# Patient Record
Sex: Male | Born: 1990 | Race: Black or African American | Hispanic: No | Marital: Single | Smoking: Former smoker
Health system: Southern US, Community
[De-identification: ages and names within clinical notes are randomized; demographics above are authoritative.]

---
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
## 2011-06-24 ENCOUNTER — Emergency Department (HOSPITAL_COMMUNITY)
Admission: EM | Admit: 2011-06-24 | Discharge: 2011-06-24 | Disposition: A | Discharge status: Home / Self Care | Payer: Self-pay | Attending: Emergency Medicine | Admitting: Emergency Medicine

## 2011-06-24 ENCOUNTER — Other Ambulatory Visit: Payer: Self-pay

## 2011-06-24 ENCOUNTER — Emergency Department (HOSPITAL_COMMUNITY): Payer: Self-pay

## 2011-06-24 DIAGNOSIS — M940 Chondrocostal junction syndrome [Tietze]: Secondary | ICD-10-CM | POA: Insufficient documentation

## 2011-06-24 MED ORDER — KETOROLAC TROMETHAMINE 30 MG/ML IJ SOLN
30.0000 mg | Freq: Once | INTRAMUSCULAR | Status: AC
  Administered 2011-06-24: 30 mg via INTRAVENOUS
  Filled 2011-06-24: qty 1

## 2011-06-24 MED ORDER — IBUPROFEN 600 MG PO TABS: 600.0000 mg | ORAL_TABLET | Freq: Four times a day (QID) | ORAL | Status: AC | PRN

## 2011-06-24 NOTE — ED Notes (Signed)
Reports intermittent substernal chest pain, describes as pressure/squeezing, x 1 month; reports onset today of same pain at 1400 while at rest; c/o nausea with pain; denies pain presently; ECG completed, placed on cardiac monitor; IV access obtained and labs drawn. A&ox4; in no distress.

## 2011-06-24 NOTE — ED Notes (Signed)
Pt states h has been having chest pain for a month or more. States it is worse when he runs

## 2011-06-24 NOTE — ED Notes (Signed)
A&ox4, in no distress

## 2011-06-24 NOTE — ED Provider Notes (Signed)
History     CSN: [id number] Arrival date & time: 06/24/2011  4:15 PM  Chief Complaint  Patient presents with  . Chest Pain   Patient is a 20 y.o. male presenting with chest pain. The history is provided by the patient.  Chest Pain The chest pain began more  than 1 month ago. Chest pain occurs intermittently. The chest pain is unchanged. The pain is associated with breathing. At its most intense, the pain is at 3/10. The pain is currently at 3/10. The severity of the pain is moderate. The quality of the pain is described as dull, pleuritic and pressure-like (Intermittent pain for past month upper sternal level, sometimes worse with exertion , but not always.). The pain does not radiate (When it happens when running,  he generally "runs through it" and it gets better.). Exacerbated by: nothing. Risk factors include no known risk factors. Family history comments: Emelia Loron only deceased last year of MI     History reviewed. No pertinent past medical history.  History reviewed. No pertinent past surgical history.  History reviewed. No pertinent family history.  History  Substance Use Topics  . Smoking status: Never Smoker   . Smokeless tobacco: Not on file  . Alcohol Use: No      Review of Systems  Cardiovascular: Positive for chest pain.    Physical Exam  BP 150/87  Pulse 77  Temp(Src) 98.2 F (36.8 C) (Oral)  Resp 20  Ht 6' (1.829 m)  Wt 190 lb (86.183 kg)  BMI 25.77 kg/m2  SpO2 100%  Physical Exam  Vitals reviewed. Constitutional: He is oriented to person, place, and time. He appears well-developed and well-nourished.  HENT:  Head: Normocephalic and atraumatic.  Eyes: Conjunctivae are normal.  Neck: Normal range of motion.  Cardiovascular: Normal rate, regular rhythm, normal heart sounds and intact distal pulses.   Pulmonary/Chest: Effort normal and breath sounds normal. He has no wheezes. He has no rales. He exhibits no tenderness.  Abdominal: Soft. Bowel sounds  are normal. There is no tenderness.  Musculoskeletal: Normal range of motion. He exhibits no edema and no tenderness.  Neurological: He is alert and oriented to person, place, and time.  Skin: Skin is warm and dry.  Psychiatric: He has a normal mood and affect.    ED Course  Procedures  MDM     Date: 06/24/2011  Rate: 81  Rhythm: normal sinus rhythm  QRS Axis: normal  Intervals: normal  ST/T Wave abnormalities: normal  Conduction Disutrbances:none  Narrative Interpretation:   - stemi,  - wpw,  - LGL,  - IHSS,  - brugada pattern.  Old EKG Reviewed: none available    Medical screening examination/treatment/procedure(s) were performed by non-physician practitioner and as supervising physician I was immediately available for consultation/collaboration.    Candis Musa, PA 06/24/11 1752  Chrisoula Zegarra K Ordean Fouts-Rasch, MD 06/24/11 2132

## 2012-07-16 ENCOUNTER — Encounter (HOSPITAL_COMMUNITY): Payer: Self-pay | Admitting: *Deleted

## 2012-07-16 ENCOUNTER — Emergency Department (HOSPITAL_COMMUNITY)
Admission: EM | Admit: 2012-07-16 | Discharge: 2012-07-16 | Disposition: A | Discharge status: Home / Self Care | Payer: No Typology Code available for payment source | Attending: Physician Assistant | Admitting: Physician Assistant

## 2012-07-16 DIAGNOSIS — M62838 Other muscle spasm: Secondary | ICD-10-CM | POA: Insufficient documentation

## 2012-07-16 DIAGNOSIS — S46919A Strain of unspecified muscle, fascia and tendon at shoulder and upper arm level, unspecified arm, initial encounter: Secondary | ICD-10-CM

## 2012-07-16 DIAGNOSIS — M549 Dorsalgia, unspecified: Secondary | ICD-10-CM | POA: Insufficient documentation

## 2012-07-16 DIAGNOSIS — S39012A Strain of muscle, fascia and tendon of lower back, initial encounter: Secondary | ICD-10-CM

## 2012-07-16 MED ORDER — DIAZEPAM 5 MG PO TABS
5.0000 mg | ORAL_TABLET | Freq: Once | ORAL | Status: AC
  Administered 2012-07-16: 5 mg via ORAL
  Filled 2012-07-16: qty 1

## 2012-07-16 MED ORDER — ONDANSETRON HCL 4 MG PO TABS
4.0000 mg | ORAL_TABLET | Freq: Once | ORAL | Status: AC
  Administered 2012-07-16: 4 mg via ORAL
  Filled 2012-07-16: qty 1

## 2012-07-16 MED ORDER — MORPHINE SULFATE 4 MG/ML IJ SOLN
8.0000 mg | Freq: Once | INTRAMUSCULAR | Status: AC
  Administered 2012-07-16: 8 mg via INTRAMUSCULAR
  Filled 2012-07-16: qty 2

## 2012-07-16 MED ORDER — METHOCARBAMOL 500 MG PO TABS: ORAL_TABLET | ORAL | Status: DC

## 2012-07-16 MED ORDER — OXYCODONE-ACETAMINOPHEN 5-325 MG PO TABS: 1.0000 | ORAL_TABLET | Freq: Four times a day (QID) | ORAL | Status: AC | PRN

## 2012-07-16 MED ORDER — MORPHINE SULFATE 4 MG/ML IJ SOLN: 8.0000 mg | Freq: Once | INTRAMUSCULAR | Status: DC

## 2012-07-16 MED ORDER — DEXAMETHASONE SODIUM PHOSPHATE 4 MG/ML IJ SOLN
8.0000 mg | Freq: Once | INTRAMUSCULAR | Status: AC
  Administered 2012-07-16: 8 mg via INTRAMUSCULAR
  Filled 2012-07-16: qty 2

## 2012-07-16 NOTE — ED Notes (Signed)
H. Bryant, PA at bedside. 
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
## 2012-07-18 NOTE — ED Provider Notes (Signed)
Medical screening examination/treatment/procedure(s) were performed by non-physician practitioner and as supervising physician I was immediately available for consultation/collaboration.  Shelda Jakes, MD 07/18/12 [phone]

## 2012-08-01 ENCOUNTER — Encounter (HOSPITAL_COMMUNITY): Payer: Self-pay

## 2012-08-01 ENCOUNTER — Emergency Department (HOSPITAL_COMMUNITY)
Admission: EM | Admit: 2012-08-01 | Discharge: 2012-08-01 | Disposition: A | Discharge status: Home / Self Care | Payer: No Typology Code available for payment source | Attending: Emergency Medicine | Admitting: Emergency Medicine

## 2012-08-01 DIAGNOSIS — M542 Cervicalgia: Secondary | ICD-10-CM | POA: Insufficient documentation

## 2012-08-01 DIAGNOSIS — M545 Low back pain, unspecified: Secondary | ICD-10-CM | POA: Insufficient documentation

## 2012-08-01 DIAGNOSIS — M62838 Other muscle spasm: Secondary | ICD-10-CM | POA: Insufficient documentation

## 2012-08-01 DIAGNOSIS — M549 Dorsalgia, unspecified: Secondary | ICD-10-CM

## 2012-08-01 MED ORDER — HYDROCODONE-ACETAMINOPHEN 5-325 MG PO TABS: ORAL_TABLET | ORAL | Status: DC

## 2012-08-01 MED ORDER — CYCLOBENZAPRINE HCL 10 MG PO TABS: ORAL_TABLET | ORAL | Status: DC

## 2012-08-01 MED ORDER — DICLOFENAC SODIUM 75 MG PO TBEC: 75.0000 mg | DELAYED_RELEASE_TABLET | Freq: Two times a day (BID) | ORAL | Status: AC
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
## 2012-08-02 NOTE — ED Provider Notes (Signed)
Medical screening examination/treatment/procedure(s) were performed by non-physician practitioner and as supervising physician I was immediately available for consultation/collaboration.   Shelda Jakes, MD 08/02/12 2122

## 2013-01-15 ENCOUNTER — Emergency Department (HOSPITAL_COMMUNITY)
Admission: EM | Admit: 2013-01-15 | Discharge: 2013-01-15 | Disposition: A | Discharge status: Home / Self Care | Payer: Self-pay | Attending: Emergency Medicine | Admitting: Emergency Medicine

## 2013-01-15 ENCOUNTER — Encounter (HOSPITAL_COMMUNITY): Payer: Self-pay | Admitting: Emergency Medicine

## 2013-01-15 DIAGNOSIS — R51 Headache: Secondary | ICD-10-CM | POA: Insufficient documentation

## 2013-01-15 DIAGNOSIS — Z87891 Personal history of nicotine dependence: Secondary | ICD-10-CM | POA: Insufficient documentation

## 2013-01-15 MED ORDER — DIPHENHYDRAMINE HCL 25 MG PO CAPS
25.0000 mg | ORAL_CAPSULE | Freq: Once | ORAL | Status: AC
  Administered 2013-01-15: 25 mg via ORAL
  Filled 2013-01-15: qty 1

## 2013-01-15 MED ORDER — ONDANSETRON 4 MG PO TBDP
4.0000 mg | ORAL_TABLET | Freq: Once | ORAL | Status: AC
  Administered 2013-01-15: 4 mg via ORAL
  Filled 2013-01-15: qty 1

## 2013-01-15 MED ORDER — KETOROLAC TROMETHAMINE 60 MG/2ML IM SOLN
60.0000 mg | Freq: Once | INTRAMUSCULAR | Status: DC
  Filled 2013-01-15: qty 2

## 2013-01-15 MED ORDER — TRAMADOL HCL 50 MG PO TABS
ORAL_TABLET | ORAL | Status: AC
  Administered 2013-01-15: 50 mg
  Filled 2013-01-15: qty 1

## 2013-01-15 NOTE — ED Provider Notes (Signed)
History     CSN: [id number]  Arrival date & time 01/15/13  0107   First MD Initiated Contact with Patient 01/15/13 0125      Chief Complaint  Patient presents with  . Headache    (Consider location/radiation/quality/duration/timing/severity/associated sxs/prior treatment) HPI Aaron Gutierrez is a 22 y.o. male who presents to the Emergency Department complaining of a headache he has had for 6 days. It goes away with excedrin migraine and returns each day. There is pressure behind his eyes. Currently has a headache. He has had his eyes checked by an optometrist and they are normal.  History reviewed. No pertinent past medical history.  History reviewed. No pertinent past surgical history.  No family history on file.  History  Substance Use Topics  . Smoking status: Former Games developer  . Smokeless tobacco: Not on file  . Alcohol Use: No      Review of Systems  Constitutional: Negative for fever.       10 Systems reviewed and are negative for acute change except as noted in the HPI.  HENT: Negative for congestion.   Eyes: Negative for discharge and redness.  Respiratory: Negative for cough and shortness of breath.   Cardiovascular: Negative for chest pain.  Gastrointestinal: Negative for vomiting and abdominal pain.  Musculoskeletal: Negative for back pain.  Skin: Negative for rash.  Neurological: Positive for headaches. Negative for syncope and numbness.  Psychiatric/Behavioral:       No behavior change.    Allergies  Review of patient's allergies indicates no known allergies.  Home Medications   Current Outpatient Rx  Name  Route  Sig  Dispense  Refill  . methocarbamol (ROBAXIN) 500 MG tablet   Oral   Take 500 mg by mouth 3 (three) times daily as needed. Muscle Spasms         . cyclobenzaprine (FLEXERIL) 10 MG tablet      1 po tid for spasm   21 tablet   0   . diclofenac (VOLTAREN) 75 MG EC tablet   Oral   Take 1 tablet (75 mg total) by mouth 2 (two) times  daily.   12 tablet   0   . HYDROcodone-acetaminophen (NORCO/VICODIN) 5-325 MG per tablet      1 or 2 po q4h prn pain   20 tablet   0   . tetrahydrozoline-zinc (VISINE-AC) 0.05-0.25 % ophthalmic solution   Both Eyes   Place 2 drops into both eyes daily as needed. Dry Eyes           BP 135/77  Pulse 106  Temp(Src) 100.7 F (38.2 C) (Oral)  Resp 20  Ht 5\' 11"  (1.803 m)  Wt 205 lb (92.987 kg)  BMI 28.6 kg/m2  SpO2 100%  Physical Exam  Nursing note and vitals reviewed. Constitutional: He is oriented to person, place, and time.  Awake, alert, nontoxic appearance.  HENT:  Head: Normocephalic and atraumatic.  Right Ear: External ear normal.  Left Ear: External ear normal.  Mouth/Throat: Oropharynx is clear and moist.  Eyes: Conjunctivae and EOM are normal. Pupils are equal, round, and reactive to light.  Neck: Normal range of motion. Neck supple.  Cardiovascular: Normal rate.   Pulmonary/Chest: Effort normal and breath sounds normal. He exhibits no tenderness.  Abdominal: Soft. Bowel sounds are normal. There is no tenderness. There is no rebound.  Musculoskeletal: Normal range of motion. He exhibits no tenderness.  Baseline ROM, no obvious new focal weakness.  Neurological: He is alert and oriented  to person, place, and time. He has normal reflexes.  Mental status and motor strength appears baseline for patient and situation.  Skin: No rash noted.  Psychiatric: He has a normal mood and affect.    ED Course  Procedures (including critical care time)      MDM  Patient with a headache x 6 days. Given a headache cocktail with relief. Pt stable in ED with no significant deterioration in condition.The patient appears reasonably screened and/or stabilized for discharge and I doubt any other medical condition or other [facility] requiring further screening, evaluation, or treatment in the ED at this time prior to discharge.  MDM Reviewed: nursing note and  vitals           Nicoletta Dress. Colon Branch, MD 01/15/13 8413

## 2013-01-15 NOTE — ED Notes (Signed)
Advised pt that if his headache doesn't go away to come back.

## 2013-01-15 NOTE — ED Notes (Signed)
Patient c/o headaches x 6 days with dizziness when he stands up.  Patient c/o dry mouth and nausea.  States today his hands "changed color"; states they turned pale.

## 2014-08-23 ENCOUNTER — Emergency Department (HOSPITAL_COMMUNITY)
Admission: EM | Admit: 2014-08-23 | Discharge: 2014-08-23 | Disposition: A | Discharge status: Home / Self Care | Payer: No Typology Code available for payment source | Attending: Emergency Medicine | Admitting: Emergency Medicine

## 2014-08-23 ENCOUNTER — Encounter (HOSPITAL_COMMUNITY): Payer: Self-pay | Admitting: Emergency Medicine

## 2014-08-23 DIAGNOSIS — R112 Nausea with vomiting, unspecified: Secondary | ICD-10-CM | POA: Insufficient documentation

## 2014-08-23 DIAGNOSIS — R197 Diarrhea, unspecified: Secondary | ICD-10-CM | POA: Insufficient documentation

## 2014-08-23 DIAGNOSIS — Z87891 Personal history of nicotine dependence: Secondary | ICD-10-CM | POA: Insufficient documentation

## 2014-08-23 NOTE — ED Provider Notes (Signed)
CSN: [id number]     Arrival date & time 08/23/14  0736 History   First MD Initiated Contact with Patient 08/23/14 0800     Chief Complaint  Patient presents with  . Diarrhea     (Consider location/radiation/quality/duration/timing/severity/associated sxs/prior Treatment) Patient is a 23 y.o. male presenting with diarrhea. The history is provided by the patient.  Diarrhea Quality:  Watery Severity:  Moderate Onset quality:  Gradual Duration:  12 hours Timing:  Sporadic Progression:  Resolved Worsened by:  Nothing tried Ineffective treatments:  None tried  Derek JackBrandon Cosper is a 23 y.o. male who presents to the ED after having n/v/d yesterday. He is better today and thinks that he may have eaten something that made him sick. He was out of work due to the illness and needs a note for work.    History reviewed. No pertinent past medical history. History reviewed. No pertinent past surgical history. No family history on file. History  Substance Use Topics  . Smoking status: Former Games developermoker  . Smokeless tobacco: Not on file  . Alcohol Use: No    Review of Systems  Gastrointestinal: Positive for diarrhea.  All other systems negative    Allergies  Review of patient's allergies indicates no known allergies.  Home Medications   Prior to Admission medications   Medication Sig Start Date End Date Taking? Authorizing Provider  cyclobenzaprine (FLEXERIL) 10 MG tablet 1 po tid for spasm 08/01/12   Kathie DikeHobson M Bryant, PA-C  HYDROcodone-acetaminophen (NORCO/VICODIN) 5-325 MG per tablet 1 or 2 po q4h prn pain 08/01/12   Kathie DikeHobson M Bryant, PA-C  methocarbamol (ROBAXIN) 500 MG tablet Take 500 mg by mouth 3 (three) times daily as needed. Muscle Spasms 07/16/12   Kathie DikeHobson M Bryant, PA-C  tetrahydrozoline-zinc (VISINE-AC) 0.05-0.25 % ophthalmic solution Place 2 drops into both eyes daily as needed. Dry Eyes    Historical Provider, MD   BP 128/77  Pulse 71  Temp(Src) 98.4 F (36.9 C) (Oral)  Resp  16  Wt 210 lb (95.255 kg)  SpO2 100% Physical Exam  Nursing note and vitals reviewed. Constitutional: He is oriented to person, place, and time. He appears well-developed and well-nourished.  HENT:  Head: Normocephalic.  Eyes: EOM are normal.  Neck: Normal range of motion. Neck supple.  Cardiovascular: Normal rate and regular rhythm.   Pulmonary/Chest: Effort normal and breath sounds normal.  Abdominal: Soft. There is no tenderness.  Musculoskeletal: Normal range of motion.  Neurological: He is alert and oriented to person, place, and time. No cranial nerve deficit.  Skin: Skin is warm and dry.  Psychiatric: He has a normal mood and affect. His behavior is normal.    ED Course  Procedures (  MDM  23 y.o. male with hx of n/v/d after eating out yesterday. Symptoms have resolved. Patient needs work note. Will give work note and he will return for any problems.    [address] Broad LaneHope ChapmanM Maylynn Orzechowski, [place]NP 08/23/14 [phone]

## 2014-08-23 NOTE — ED Notes (Signed)
C/o diarrhea and vomiting that occurred yesterday. No occurrences today.

## 2014-08-23 NOTE — ED Provider Notes (Signed)
Medical screening examination/treatment/procedure(s) were performed by non-physician practitioner and as supervising physician I was immediately available for consultation/collaboration.   EKG Interpretation None        Courtney F Horton, MD 08/23/14 1919 

## 2014-08-23 NOTE — ED Notes (Signed)
Patient with no complaints at this time. Respirations even and unlabored. Skin warm/dry. Discharge instructions reviewed with patient at this time. Patient given opportunity to voice concerns/ask questions. Patient discharged at this time and left Emergency Department with steady gait.   

## 2014-12-20 ENCOUNTER — Encounter (HOSPITAL_COMMUNITY): Payer: Self-pay | Admitting: *Deleted

## 2014-12-20 ENCOUNTER — Emergency Department (HOSPITAL_COMMUNITY)
Admission: EM | Admit: 2014-12-20 | Discharge: 2014-12-20 | Disposition: A | Discharge status: Home / Self Care | Payer: No Typology Code available for payment source | Attending: Emergency Medicine | Admitting: Emergency Medicine

## 2014-12-20 DIAGNOSIS — Z87891 Personal history of nicotine dependence: Secondary | ICD-10-CM | POA: Insufficient documentation

## 2014-12-20 DIAGNOSIS — R109 Unspecified abdominal pain: Secondary | ICD-10-CM | POA: Insufficient documentation

## 2014-12-20 DIAGNOSIS — R51 Headache: Secondary | ICD-10-CM | POA: Insufficient documentation

## 2014-12-20 DIAGNOSIS — R509 Fever, unspecified: Secondary | ICD-10-CM | POA: Insufficient documentation

## 2014-12-20 DIAGNOSIS — Z79899 Other long term (current) drug therapy: Secondary | ICD-10-CM | POA: Insufficient documentation

## 2014-12-20 DIAGNOSIS — R112 Nausea with vomiting, unspecified: Secondary | ICD-10-CM | POA: Insufficient documentation

## 2014-12-20 DIAGNOSIS — R197 Diarrhea, unspecified: Secondary | ICD-10-CM | POA: Insufficient documentation

## 2014-12-20 LAB — BASIC METABOLIC PANEL
Anion gap: 6 (ref 5–15)
BUN: 11 mg/dL (ref 6–23)
CO2: 26 mmol/L (ref 19–32)
Calcium: 9 mg/dL (ref 8.4–10.5)
Chloride: 103 mmol/L (ref 96–112)
Creatinine, Ser: 0.75 mg/dL (ref 0.50–1.35)
GFR calc Af Amer: >90 mL/min (ref >90)
GFR calc non Af Amer: >90 mL/min (ref >90)
Glucose, Bld: 111 mg/dL — ABNORMAL HIGH (ref 70–99)
Potassium: 3.7 mmol/L (ref 3.5–5.1)
Sodium: 135 mmol/L (ref 135–145)

## 2014-12-20 LAB — URINALYSIS, ROUTINE W REFLEX MICROSCOPIC
Bilirubin Urine: NEGATIVE
Glucose, UA: NEGATIVE mg/dL
Hgb urine dipstick: NEGATIVE
Ketones, ur: NEGATIVE mg/dL
Leukocytes, UA: NEGATIVE
Nitrite: NEGATIVE
Protein, ur: NEGATIVE mg/dL
Specific Gravity, Urine: 1.01 (ref 1.005–1.030)
Urobilinogen, UA: 1 mg/dL (ref 0.0–1.0)
pH: 7 (ref 5.0–8.0)

## 2014-12-20 LAB — CBC WITH DIFFERENTIAL/PLATELET
Basophils Absolute: 0 10*3/uL (ref 0.0–0.1)
Basophils Relative: 0 % (ref 0–1)
Eosinophils Absolute: 0 10*3/uL (ref 0.0–0.7)
Eosinophils Relative: 1 % (ref 0–5)
HCT: 44.3 % (ref 39.0–52.0)
Hemoglobin: 14.5 g/dL (ref 13.0–17.0)
Lymphocytes Relative: 8 % — ABNORMAL LOW (ref 12–46)
Lymphs Abs: 0.4 10*3/uL — ABNORMAL LOW (ref 0.7–4.0)
MCH: 29 pg (ref 26.0–34.0)
MCHC: 32.7 g/dL (ref 30.0–36.0)
MCV: 88.6 fL (ref 78.0–100.0)
Monocytes Absolute: 0.3 10*3/uL (ref 0.1–1.0)
Monocytes Relative: 6 % (ref 3–12)
Neutro Abs: 4.4 10*3/uL (ref 1.7–7.7)
Neutrophils Relative %: 85 % — ABNORMAL HIGH (ref 43–77)
Platelets: 222 10*3/uL (ref 150–400)
RBC: 5 MIL/uL (ref 4.22–5.81)
RDW: 12.5 % (ref 11.5–15.5)
WBC: 5.2 10*3/uL (ref 4.0–10.5)

## 2014-12-20 MED ORDER — SODIUM CHLORIDE 0.9 % IV BOLUS (SEPSIS): 1000.0000 mL | Freq: Once | INTRAVENOUS | Status: DC

## 2014-12-20 MED ORDER — KETOROLAC TROMETHAMINE 30 MG/ML IJ SOLN: 15.0000 mg | Freq: Once | INTRAMUSCULAR | Status: DC

## 2014-12-20 MED ORDER — MORPHINE SULFATE 4 MG/ML IJ SOLN: 4.0000 mg | Freq: Once | INTRAMUSCULAR | Status: DC

## 2014-12-20 MED ORDER — IBUPROFEN 400 MG PO TABS
600.0000 mg | ORAL_TABLET | Freq: Once | ORAL | Status: AC
  Administered 2014-12-20: 600 mg via ORAL
  Filled 2014-12-20: qty 2

## 2014-12-20 MED ORDER — ONDANSETRON HCL 4 MG/2ML IJ SOLN: 4.0000 mg | Freq: Once | INTRAMUSCULAR | Status: DC

## 2014-12-20 MED ORDER — ONDANSETRON 4 MG PO TBDP
4.0000 mg | ORAL_TABLET | Freq: Once | ORAL | Status: AC
  Administered 2014-12-20: 4 mg via ORAL
  Filled 2014-12-20: qty 1

## 2014-12-20 MED ORDER — ONDANSETRON HCL 4 MG PO TABS: 4.0000 mg | ORAL_TABLET | Freq: Four times a day (QID) | ORAL | Status: DC
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
## 2014-12-20 NOTE — ED Notes (Signed)
Vomiting, diarrhea.  abd pain

## 2014-12-20 NOTE — ED Notes (Signed)
Pt c/o n/v/d and abd pain since 7:30 this morning.  Attempted IV x 2 but was unsuccessful.  Pt requesting po medications.  Notified Dr. Juleen ChinaKohut.

## 2014-12-20 NOTE — Discharge Instructions (Signed)
°Emergency Department Resource Guide °1) Find a Doctor and Pay Out of Pocket °Although you won't have to find out who is covered by your insurance plan, it is a good idea to ask around and get recommendations. You will then need to call the office and see if the doctor you have chosen will accept you as a new patient and what types of options they offer for patients who are self-pay. Some doctors offer discounts or will set up payment plans for their patients who do not have insurance, but you will need to ask so you aren't surprised when you get to your appointment. ° °2) Contact Your Local Health Department °Not all health departments have doctors that can see patients for sick visits, but many do, so it is worth a call to see if yours does. If you don't know where your local health department is, you can check in your phone book. The CDC also has a tool to help you locate your state's health department, and many state websites also have listings of all of their local health departments. ° °3) Find a Walk-in Clinic °If your illness is not likely to be very severe or complicated, you may want to try a walk in clinic. These are popping up all over the country in pharmacies, drugstores, and shopping centers. They're usually staffed by nurse practitioners or physician assistants that have been trained to treat common illnesses and complaints. They're usually fairly quick and inexpensive. However, if you have serious medical issues or chronic medical problems, these are probably not your best option. ° °No Primary Care Doctor: °- Call [facility] at  832-8000 - they can help you locate a primary care doctor that  accepts your insurance, provides certain services, etc. °- Physician Referral Service- 1-800-533-3463 ° °Chronic Pain Problems: °Organization         Address  Phone   Notes  °[facility]  (336) 297-2271 Patients need to be referred by their primary care doctor.  ° °Medication  Assistance: °Organization         Address  Phone   Notes  °[place] [facility] [address] °[place], [place] [address] (336) 641-8030 --Must be a resident of [place] °-- Must have NO insurance coverage whatsoever (no Medicaid/ Medicare, etc.) °-- The pt. MUST have a primary care doctor that directs their care regularly and follows them in the community °  °[facility]  (866) 331-1348   °[employer]  (888) 892-1162   ° °Agencies that provide inexpensive medical care: °Organization         Address  Phone   Notes  °[facility]  (336) 832-8035   °[facility]    (336) 832-7272   °[facility] [address] °[place], [place] [address] (336) 832-4777   °[facility] [address], °[place] (336) 271-4999   °[facility]    (336) 373-0678   °[facility]    (336) 272-1050   °[facility] ° [address], [place] Phone:  (336) 832-4444, Fax:  (336) 832-4440 Hours of Operation:  9 am - 6 pm, M-F.  Also accepts Medicaid/Medicare and self-pay.  °[facility] ° [address], [place] Phone: (336) 832-3150, Fax: (336) 832-3151. Hours of Operation:  8:30 am - 5:30 pm, M-F.  Also accepts Medicaid and self-pay.  °[facility] [place] [address], [place] Phone: (336) 878-6027   °[facility] [address], [place], [place] (336)723-1848, Ext. 123 Mondays & Thursdays: 7-9 AM.  First 15 patients are seen on a first come, first serve basis. °  ° °Medicaid-accepting [place] Providers: ° °Organization         Address  Phone   Notes  °[facility] [address], [place] (336) 641-2100 Also accepts self-pay patients.  °[facility] [address], [place] ° (336) 856-9996   °[facility] [address], [place]  (336) 288-8857   °[facility] [address] [place] [address], [place] (336) 299-7000   °Veita Bland [address], [place]  ° (336) 373-1557 Only accepts [place] Access Medicaid patients after they have their name applied to their card.  ° °Self-Pay (no insurance) in [place]: ° °Organization         Address  Phone   Notes  °Sickle Cell Patients, [facility] [address], [place] (336) 832-1970   °[facility] [address], [place] (336) 832-4400   °[facility] [place] ° [address], [place] (336) 992-4800   °[facility]/Dr. Osei-Bonsu ° [address] [place] [address], [place] or [address], [place] (336) 841-8500 Phone number for both [place] and [place] locations is the same.  °[facility] [address], [place] (336) 299-0000   °[facility] [address] [place] [address], [place] or [address] (336) 852-7530 °(336) 878-2260   °[facility] [address], [place] (336) 350-1642, phone; (336) 294-5005, fax Sees patients 1st and 3rd Saturday of every month.  Must not qualify for public or private insurance (i.e. Medicaid, Medicare, [place] Health Choice, Veterans' Benefits) • Household income should be no more than 200% of the poverty level •The clinic cannot treat you if you are pregnant or think you are pregnant • Sexually transmitted diseases are not treated at the clinic.  ° ° °Dental Care: °Organization         Address  Phone  Notes  °[place] [facility] [address],  (336) 641-6152 Accepts children up to age 21 who are enrolled in Medicaid or [place] Health Choice; pregnant women with a Medicaid card; and children who have applied for Medicaid or [place] Health Choice, but were declined, whose parents can pay a reduced fee at time of service.  °[place]  [facility] [place]  [address], [place] (336) 641-7733 Accepts children up to age 21 who are enrolled in Medicaid or [place] Health Choice; pregnant women with a Medicaid card; and children who have applied for Medicaid or [place] Health Choice, but were declined, whose parents can pay a reduced fee at time of service.  °[facility] ° [address],  (336) 641-4533 Patients are seen by appointment only. Walk-ins are not accepted. [facility] will see patients 18 years of age and older. °Monday - Tuesday (8am-5pm) °Most Wednesdays (8:30-5pm) °$30 per visit, cash only  °[facility] ° [address], [place] (336) 641-4533 Patients are seen by appointment only. Walk-ins are not accepted. [facility] will see patients 18 years of age and older. °One   Wednesday Evening (Monthly: Volunteer Based).  $30 per visit, cash only  °[facility]  (919) 537-3737 for adults; Children under age 4, call Graduate Pediatric Dentistry at (919) [phone]. Children aged 4-14, please call (919) 537-3737 to request a pediatric application. ° Dental services are provided in all areas of dental care including fillings, crowns and bridges, complete and partial dentures, implants, gum treatment, root canals, and extractions. Preventive care is also provided. Treatment is provided to both adults and children. °Patients are selected via a lottery and there is often a waiting list. °  °[facility] [address], °[place] ° (336) 763-8833 [email or web address] °  °[facility] [address], [place], [place] (336)723-1848, Ext. 123 Second and Fourth Thursday of each month, opens at 6:30 AM; Clinic ends at 9 AM.  Patients are seen on a first-come first-served basis, and a limited number are seen during each clinic.  ° °[facility] ° [address], [place], [place] (336) 723-7904    Eligibility Requirements °You must have lived in [place], [place], or [place] counties for at least the last three months. °  You cannot be eligible for state or federal sponsored healthcare insurance, including Veterans Administration, Medicaid, or Medicare. °  You generally cannot be eligible for healthcare insurance through your employer.  °  How to apply: °Eligibility screenings are held every Tuesday and Wednesday afternoon from 1:00 pm until 4:00 pm. You do not need an appointment for the interview!  °[facility] [address], [place], [place] 336-631-2330   °[facility]  336-342-8273   °[facility]  336-703-3100   °[facility]  336-570-6415   ° °Behavioral Health Resources in the Community: °Intensive Outpatient Programs °Organization         Address  Phone  Notes  °[facility] [address], [place], [place] 336-878-6098   °[facility] [address], [place], [place] 336-832-9800   °[facility] [address], [place], [place] ° 336-882-2125   °[facility] [address],  °[place], [place] 1-800-853-5163 or 336-641-4981   °Substance Abuse Resources °Organization         Address  Phone  Notes  °[facility]  336-882-2125   °[facility]  336-784-9470   °[facility]  336-285-9073   °[facility]  336-845-3988   °Residential & Outpatient Substance Abuse Program  1-800-659-3381   °Psychological Services °Organization         Address  Phone  Notes  °[facility]  336- 832-9600   °[facility]  336- 378-7881   °[facility] [address], [place] 1-800-853-5163 or 336-641-4981   ° °Mobile Crisis Teams °Organization         Address  Phone  Notes  °[facility], Mobile Crisis Care Unit  1-877-626-1772   °Assertive °[facility] ° [address]  [place], [place] 336-834-9664   °Sharon DeEsch [address] °[place] 336-554-5454   ° °Self-Help/Support Groups °Organization         Address  Phone             Notes  °[facility] - variety of support groups  336- 373-1402 Call for more information  °Narcotics Anonymous (NA), [facility] [address], °[place]  2 meetings at this location  ° °  Residential Treatment Programs °Organization         Address  Phone  Notes  °[facility] [address],    °[place]  1-866-801-8205   °[facility] ° [address], [place], [place] 704-293-8524   °[facility] [address], [place] 336-845-3988 Admissions: 8am-3pm M-F  °[facility] [address]    °[place], [place] 336-841-1104   °[facility] [address], [place], [place] 336-379-7146   °[facility] [address]  °[place], [place] 336-285-9073   °[facility] - Intensive Outpatient [address], [place], [place] 336-852-3033   °[facility] [address]  °[place], [place] 1-877-615-2722 or 336-784-9470   °[facility] [address] [place], [place] 336-227-7417 Accepts Medicaid  °[facility] [address]  °[place] 1-800-659-3381 Substance Abuse/Addiction Treatment  ° °[place] Behavioral Health Resources °Organization         Address  Phone  Notes  °[facility]  (888) 581-9988   °Julie Brannon, PhD [address] [place], [place]   (336) 349-5553 or (336) 951-0000   °[facility]   [address] °[place], [place] (336) 349-4454   °[facility] [address], [place], [place] (336) 342-8316 Insurance/Medicaid/sponsorship through [facility]  °[facility] [address]                                    [place], [place] (336) 342-8316 Therapy/tele-psych/case    °[facility] [address]  ° [place],  (336) 349-2233    °Dr. Arfeen  (336) 349-4544   °[facility] [place]  [employer] [place] [facility] 1) [address], [place] °2) [address], [place] °3)  [address], [place] (336) 349-3220 °(336) 342-7768 ° °(336) 342-8140   °[place] Child Abuse [facility] (336) 342-1394 or (336) 342-3537 (After Hours)    ° ° °

## 2015-07-04 ENCOUNTER — Emergency Department (HOSPITAL_COMMUNITY)
Admission: EM | Admit: 2015-07-04 | Discharge: 2015-07-04 | Disposition: A | Discharge status: Home / Self Care | Payer: No Typology Code available for payment source | Attending: Physician Assistant | Admitting: Physician Assistant

## 2015-07-04 ENCOUNTER — Emergency Department (HOSPITAL_COMMUNITY): Payer: No Typology Code available for payment source

## 2015-07-04 ENCOUNTER — Encounter (HOSPITAL_COMMUNITY): Payer: Self-pay | Admitting: Emergency Medicine

## 2015-07-04 DIAGNOSIS — S4992XA Unspecified injury of left shoulder and upper arm, initial encounter: Secondary | ICD-10-CM | POA: Diagnosis not present

## 2015-07-04 DIAGNOSIS — M25512 Pain in left shoulder: Secondary | ICD-10-CM

## 2015-07-04 DIAGNOSIS — Y9289 Other specified places as the place of occurrence of the external cause: Secondary | ICD-10-CM | POA: Diagnosis not present

## 2015-07-04 DIAGNOSIS — Y92410 Unspecified street and highway as the place of occurrence of the external cause: Secondary | ICD-10-CM | POA: Diagnosis not present

## 2015-07-04 DIAGNOSIS — Y9389 Activity, other specified: Secondary | ICD-10-CM | POA: Diagnosis not present

## 2015-07-04 DIAGNOSIS — Y998 Other external cause status: Secondary | ICD-10-CM | POA: Diagnosis not present

## 2015-07-04 DIAGNOSIS — Z87891 Personal history of nicotine dependence: Secondary | ICD-10-CM | POA: Diagnosis not present

## 2015-07-04 MED ORDER — HYDROCODONE-ACETAMINOPHEN 5-325 MG PO TABS: 1.0000 | ORAL_TABLET | ORAL | Status: DC | PRN

## 2015-07-04 MED ORDER — IBUPROFEN 800 MG PO TABS
800.0000 mg | ORAL_TABLET | Freq: Once | ORAL | Status: AC
Start: 2015-07-04 — End: 2015-07-04
  Administered 2015-07-04: 800 mg via ORAL
  Filled 2015-07-04: qty 1

## 2015-07-04 MED ORDER — CYCLOBENZAPRINE HCL 10 MG PO TABS: 10.0000 mg | ORAL_TABLET | Freq: Two times a day (BID) | ORAL | Status: DC | PRN

## 2015-07-04 MED ORDER — CYCLOBENZAPRINE HCL 10 MG PO TABS
10.0000 mg | ORAL_TABLET | Freq: Once | ORAL | Status: AC
  Administered 2015-07-04: 10 mg via ORAL
  Filled 2015-07-04: qty 1
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
## 2015-07-04 NOTE — Discharge Instructions (Signed)
Take Advil in addition to the medications we give you. Follow up with Dr. Romeo Apple if symptoms persist.

## 2015-08-08 ENCOUNTER — Emergency Department (HOSPITAL_COMMUNITY): Payer: No Typology Code available for payment source

## 2015-08-08 ENCOUNTER — Encounter (HOSPITAL_COMMUNITY): Payer: Self-pay | Admitting: Emergency Medicine

## 2015-08-08 ENCOUNTER — Emergency Department (HOSPITAL_COMMUNITY)
Admission: EM | Admit: 2015-08-08 | Discharge: 2015-08-08 | Disposition: A | Discharge status: Home / Self Care | Payer: No Typology Code available for payment source | Attending: Emergency Medicine | Admitting: Emergency Medicine

## 2015-08-08 DIAGNOSIS — Z87891 Personal history of nicotine dependence: Secondary | ICD-10-CM | POA: Insufficient documentation

## 2015-08-08 DIAGNOSIS — M545 Low back pain: Secondary | ICD-10-CM | POA: Insufficient documentation

## 2015-08-08 DIAGNOSIS — M436 Torticollis: Secondary | ICD-10-CM | POA: Insufficient documentation

## 2015-08-08 DIAGNOSIS — M542 Cervicalgia: Secondary | ICD-10-CM | POA: Diagnosis present

## 2015-08-08 DIAGNOSIS — R52 Pain, unspecified: Secondary | ICD-10-CM

## 2015-08-08 MED ORDER — IBUPROFEN 800 MG PO TABS
800.0000 mg | ORAL_TABLET | Freq: Once | ORAL | Status: AC
  Administered 2015-08-08: 800 mg via ORAL
  Filled 2015-08-08: qty 1

## 2015-08-08 MED ORDER — IBUPROFEN 600 MG PO TABS: 600.0000 mg | ORAL_TABLET | Freq: Four times a day (QID) | ORAL | Status: DC | PRN

## 2015-08-08 MED ORDER — CYCLOBENZAPRINE HCL 5 MG PO TABS: 5.0000 mg | ORAL_TABLET | Freq: Three times a day (TID) | ORAL | Status: DC | PRN

## 2015-08-08 MED ORDER — CYCLOBENZAPRINE HCL 10 MG PO TABS
10.0000 mg | ORAL_TABLET | Freq: Once | ORAL | Status: AC
  Administered 2015-08-08: 10 mg via ORAL
  Filled 2015-08-08: qty 1

## 2015-08-08 NOTE — Discharge Instructions (Signed)
Torticollis, Acute You have suddenly (acutely) developed a twisted neck (torticollis). This is usually a self-limited condition. CAUSES  Acute torticollis may be caused by malposition, trauma or infection. Most commonly, acute torticollis is caused by sleeping in an awkward position. Torticollis may also be caused by the flexion, extension or twisting of the neck muscles beyond their normal position. Sometimes, the exact cause may not be known. SYMPTOMS  Usually, there is pain and limited movement of the neck. Your neck may twist to one side. DIAGNOSIS  The diagnosis is often made by physical examination. X-rays, CT scans or MRIs may be done if there is a history of trauma or concern of infection. TREATMENT  For a common, stiff neck that develops during sleep, treatment is focused on relaxing the contracted neck muscle. Medications (including shots) may be used to treat the problem. Most cases resolve in several days. Torticollis usually responds to conservative physical therapy. If left untreated, the shortened and spastic neck muscle can cause deformities in the face and neck. Rarely, surgery is required. HOME CARE INSTRUCTIONS   Use over-the-counter and prescription medications as directed by your caregiver.  Do stretching exercises and massage the neck as directed by your caregiver.  Follow up with physical therapy if needed and as directed by your caregiver. SEEK IMMEDIATE MEDICAL CARE IF:   You develop difficulty breathing or noisy breathing (stridor).  You drool, develop trouble swallowing or have pain with swallowing.  You develop numbness or weakness in the hands or feet.  You have changes in speech or vision.  You have problems with urination or bowel movements.  You have difficulty walking.  You have a fever.  You have increased pain. MAKE SURE YOU:   Understand these instructions.  Will watch your condition.  Will get help right away if you are not doing well or  get worse. Document Released: 10/30/2000 Document Revised: 01/25/2012 Document Reviewed: 12/11/2009 [facility] Patient Information 2015 [employer], [place]. This information is not intended to replace advice given to you by your health care provider. Make sure you discuss any questions you have with your health care provider.   Take your next dose of each medicine tomorrow morning.  Use caution taking the flexeril as it may cause drowsiness.  Apply a heating pad to your neck for 20 minutes then do your stretching exercises as discussed - do this 3 times daily.  You should get improved range of motion and less pain with each treatment.
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
## 2016-03-23 ENCOUNTER — Emergency Department (HOSPITAL_COMMUNITY)
Admission: EM | Admit: 2016-03-23 | Discharge: 2016-03-23 | Disposition: A | Discharge status: Home / Self Care | Payer: Self-pay | Attending: Emergency Medicine | Admitting: Emergency Medicine

## 2016-03-23 ENCOUNTER — Encounter (HOSPITAL_COMMUNITY): Payer: Self-pay | Admitting: Emergency Medicine

## 2016-03-23 ENCOUNTER — Emergency Department (HOSPITAL_COMMUNITY): Payer: Self-pay

## 2016-03-23 DIAGNOSIS — Z87891 Personal history of nicotine dependence: Secondary | ICD-10-CM | POA: Insufficient documentation

## 2016-03-23 DIAGNOSIS — R197 Diarrhea, unspecified: Secondary | ICD-10-CM | POA: Insufficient documentation

## 2016-03-23 DIAGNOSIS — R112 Nausea with vomiting, unspecified: Secondary | ICD-10-CM

## 2016-03-23 LAB — COMPREHENSIVE METABOLIC PANEL
ALBUMIN: 4.1 g/dL (ref 3.5–5.0)
ALT: 20 U/L (ref 17–63)
ANION GAP: 7 (ref 5–15)
AST: 19 U/L (ref 15–41)
Alkaline Phosphatase: 44 U/L (ref 38–126)
BUN: 9 mg/dL (ref 6–20)
CO2: 26 mmol/L (ref 22–32)
Calcium: 9.1 mg/dL (ref 8.9–10.3)
Chloride: 102 mmol/L (ref 101–111)
Creatinine, Ser: 0.82 mg/dL (ref 0.61–1.24)
GFR calc Af Amer: >60 mL/min (ref >60)
GFR calc non Af Amer: >60 mL/min (ref >60)
GLUCOSE: 88 mg/dL (ref 65–99)
POTASSIUM: 3.6 mmol/L (ref 3.5–5.1)
SODIUM: 135 mmol/L (ref 135–145)
Total Bilirubin: 0.5 mg/dL (ref 0.3–1.2)
Total Protein: 7.6 g/dL (ref 6.5–8.1)

## 2016-03-23 LAB — CBC
HEMATOCRIT: 41.2 % (ref 39.0–52.0)
HEMOGLOBIN: 13.6 g/dL (ref 13.0–17.0)
MCH: 29.4 pg (ref 26.0–34.0)
MCHC: 33 g/dL (ref 30.0–36.0)
MCV: 89.2 fL (ref 78.0–100.0)
Platelets: 256 10*3/uL (ref 150–400)
RBC: 4.62 MIL/uL (ref 4.22–5.81)
RDW: 12.1 % (ref 11.5–15.5)
WBC: 6.3 10*3/uL (ref 4.0–10.5)

## 2016-03-23 LAB — LIPASE, BLOOD: Lipase: 19 U/L (ref 11–51)

## 2016-03-23 MED ORDER — FAMOTIDINE 20 MG PO TABS: 20.0000 mg | ORAL_TABLET | Freq: Two times a day (BID) | ORAL | Status: DC

## 2016-03-23 MED ORDER — FAMOTIDINE 20 MG PO TABS
40.0000 mg | ORAL_TABLET | Freq: Once | ORAL | Status: AC
  Administered 2016-03-23: 40 mg via ORAL
  Filled 2016-03-23: qty 2

## 2016-03-23 MED ORDER — ONDANSETRON HCL 4 MG PO TABS: 4.0000 mg | ORAL_TABLET | Freq: Three times a day (TID) | ORAL | Status: DC | PRN

## 2016-03-23 MED ORDER — DICYCLOMINE HCL 10 MG PO CAPS
20.0000 mg | ORAL_CAPSULE | Freq: Once | ORAL | Status: AC
  Administered 2016-03-23: 20 mg via ORAL
  Filled 2016-03-23: qty 2

## 2016-03-23 MED ORDER — DICYCLOMINE HCL 20 MG PO TABS: 20.0000 mg | ORAL_TABLET | Freq: Four times a day (QID) | ORAL | Status: DC | PRN

## 2016-03-23 MED ORDER — ONDANSETRON 8 MG PO TBDP
8.0000 mg | ORAL_TABLET | Freq: Once | ORAL | Status: AC
  Administered 2016-03-23: 8 mg via ORAL
  Filled 2016-03-23: qty 1

## 2016-03-23 NOTE — ED Notes (Addendum)
Pt given water to drink for fluid challenge. Pt able to keep water down but did report that it made him feel more nauseated. No vomiting at this time.

## 2016-03-23 NOTE — ED Notes (Signed)
Pt in room, fully dressed- reports he just went to the bathroom. When asked if he was told we would need a urine from him, he states he was aware- He is given a urine cup and encouraged to provide the urine as requested and told why: to rule out UTI and/or renal disease. Pt rports that he will try

## 2016-03-23 NOTE — ED Notes (Signed)
PT c/o all over abdominal pain with n/v/d x3 starting at 0545 this am. PT denies any urinary symptoms.

## 2016-03-23 NOTE — Discharge Instructions (Signed)
Take the prescriptions as directed.  Increase your fluid intake (ie:  Gatoraide) for the next few days.  Eat a bland diet and advance to your regular diet slowly as you can tolerate it.   Avoid full strength juices, as well as milk and milk products until your diarrhea has resolved.   Call your regular medical doctor tomorrow to schedule a follow up appointment this week.  Return to the Emergency Department immediately if not improving (or even worsening) despite taking the medicines as prescribed, any black or bloody stool or vomit, if you develop a fever over "101," or for any other concerns. ° °

## 2016-03-23 NOTE — ED Provider Notes (Signed)
CSN: [id number]     Arrival date & time 03/23/16  1349 History   First MD Initiated Contact with Patient 03/23/16 1702     Chief Complaint  Patient presents with  . Abdominal Pain      HPI Pt was seen at 1705. Per pt, c/o gradual onset and persistence of multiple intermittent episodes of N/V/D that began approximately 0500 this morning.  Describes the stools as "watery." Has been associated with generalized abd "pain." Pt is unable to describe his abd pain; other than stating "it's getting better now." Denies CP/SOB, no back pain, no fevers, no black or blood in stools or emesis.      History reviewed. No pertinent past medical history.   History reviewed. No pertinent past surgical history.  Social History  Substance Use Topics  . Smoking status: Former Games developer  . Smokeless tobacco: Former Neurosurgeon  . Alcohol Use: No    Review of Systems ROS: Statement: All systems negative except as marked or noted in the HPI; Constitutional: Negative for fever and chills. ; ; Eyes: Negative for eye pain, redness and discharge. ; ; ENMT: Negative for ear pain, hoarseness, nasal congestion, sinus pressure and sore throat. ; ; Cardiovascular: Negative for chest pain, palpitations, diaphoresis, dyspnea and peripheral edema. ; ; Respiratory: Negative for cough, wheezing and stridor. ; ; Gastrointestinal: +N/V/D, abd pain. Negative for blood in stool, hematemesis, jaundice and rectal bleeding. . ; ; Genitourinary: Negative for dysuria, flank pain and hematuria. ; ; Musculoskeletal: Negative for back pain and neck pain. Negative for swelling and trauma.; ; Skin: Negative for pruritus, rash, abrasions, blisters, bruising and skin lesion.; ; Neuro: Negative for headache, lightheadedness and neck stiffness. Negative for weakness, altered level of consciousness , altered mental status, extremity weakness, paresthesias, involuntary movement, seizure and syncope.     Allergies  Review of patient's allergies indicates  no known allergies.  Home Medications   Prior to Admission medications   Not on File   BP 129/77 mmHg  Pulse 43  Resp 18  Ht 6' (1.829 m)  Wt 240 lb (108.863 kg)  BMI 32.54 kg/m2  SpO2 82% Physical Exam  1710: Physical examination:  Nursing notes reviewed; Vital signs and O2 SAT reviewed;  Constitutional: Well developed, Well nourished, Well hydrated, In no acute distress; Head:  Normocephalic, atraumatic; Eyes: EOMI, PERRL, No scleral icterus; ENMT: Mouth and pharynx normal, Mucous membranes moist; Neck: Supple, Full range of motion, No lymphadenopathy; Cardiovascular: Regular rate and rhythm, No murmur, rub, or gallop; Respiratory: Breath sounds clear & equal bilaterally, No rales, rhonchi, wheezes.  Speaking full sentences with ease, Normal respiratory effort/excursion; Chest: Nontender, Movement normal; Abdomen: Soft, +mild mid-epigastric tenderness to palp. No rebound or guarding. Nondistended, Normal bowel sounds; Genitourinary: No CVA tenderness; Extremities: Pulses normal, No tenderness, No edema, No calf edema or asymmetry.; Neuro: AA&Ox3, Major CN grossly intact.  Speech clear. No gross focal motor or sensory deficits in extremities. Climbs on and off stretcher easily by himself. Gait steady.; Skin: Color normal, Warm, Dry.   ED Course  Procedures (including critical care time) Labs Review   Imaging Review  I have personally reviewed and evaluated these images and lab results as part of my medical decision-making.   EKG Interpretation None      MDM  MDM Reviewed: previous chart, nursing note and vitals Reviewed previous: labs Interpretation: labs and x-ray     Results for orders placed or performed during the hospital encounter of 03/23/16  Lipase, blood  Result Value Ref Range   Lipase 19 11 - 51 U/L  Comprehensive metabolic panel  Result Value Ref Range   Sodium 135 135 - 145 mmol/L   Potassium 3.6 3.5 - 5.1 mmol/L   Chloride 102 101 - 111 mmol/L   CO2 26  22 - 32 mmol/L   Glucose, Bld 88 65 - 99 mg/dL   BUN 9 6 - 20 mg/dL   Creatinine, Ser 1.470.82 0.61 - 1.24 mg/dL   Calcium 9.1 8.9 - 82.910.3 mg/dL   Total Protein 7.6 6.5 - 8.1 g/dL   Albumin 4.1 3.5 - 5.0 g/dL   AST 19 15 - 41 U/L   ALT 20 17 - 63 U/L   Alkaline Phosphatase 44 38 - 126 U/L   Total Bilirubin 0.5 0.3 - 1.2 mg/dL   GFR calc non Af Amer >60 >60 mL/min   GFR calc Af Amer >60 >60 mL/min   Anion gap 7 5 - 15  CBC  Result Value Ref Range   WBC 6.3 4.0 - 10.5 K/uL   RBC 4.62 4.22 - 5.81 MIL/uL   Hemoglobin 13.6 13.0 - 17.0 g/dL   HCT 56.241.2 13.039.0 - 86.552.0 %   MCV 89.2 78.0 - 100.0 fL   MCH 29.4 26.0 - 34.0 pg   MCHC 33.0 30.0 - 36.0 g/dL   RDW 78.412.1 69.611.5 - 29.515.5 %   Platelets 256 150 - 400 K/uL   Dg Abd Acute W/chest 03/23/2016  CLINICAL DATA:  Generalized abdominal pain, nausea, vomiting. EXAM: DG ABDOMEN ACUTE W/ 1V CHEST COMPARISON:  Chest x-ray 06/2011 FINDINGS: There is no evidence of dilated bowel loops or free intraperitoneal air. No radiopaque calculi or other significant radiographic abnormality is seen. Heart size and mediastinal contours are within normal limits. Both lungs are clear. IMPRESSION: Negative abdominal radiographs.  No acute cardiopulmonary disease. Electronically Signed   By: Charlett NoseKevin  Dover M.D.   On: 03/23/2016 17:37    1930:  Pt has tol PO well while in the ED without N/V.  No stooling while in the ED.  Abd benign, VSS. Feels better and wants to go home now. Refuses to give urine sample. Dx and testing d/w pt.  Questions answered.  Verb understanding, agreeable to d/c home with outpt f/u.    Samuel JesterKathleen Khylah Kendra, DO 03/25/16 1359

## 2016-03-23 NOTE — ED Notes (Signed)
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 Ambulatory to exit erect, verbalizes understanding of DC instruct, med regime and follow up

## 2018-06-29 ENCOUNTER — Emergency Department (HOSPITAL_COMMUNITY): Payer: Self-pay

## 2018-06-29 ENCOUNTER — Emergency Department (HOSPITAL_COMMUNITY)
Admission: EM | Admit: 2018-06-29 | Discharge: 2018-06-29 | Disposition: A | Discharge status: Home / Self Care | Payer: Self-pay | Attending: Emergency Medicine | Admitting: Emergency Medicine

## 2018-06-29 ENCOUNTER — Other Ambulatory Visit: Payer: Self-pay

## 2018-06-29 ENCOUNTER — Encounter (HOSPITAL_COMMUNITY): Payer: Self-pay | Admitting: Emergency Medicine

## 2018-06-29 DIAGNOSIS — X509XXA Other and unspecified overexertion or strenuous movements or postures, initial encounter: Secondary | ICD-10-CM | POA: Insufficient documentation

## 2018-06-29 DIAGNOSIS — Y999 Unspecified external cause status: Secondary | ICD-10-CM | POA: Insufficient documentation

## 2018-06-29 DIAGNOSIS — S46001A Unspecified injury of muscle(s) and tendon(s) of the rotator cuff of right shoulder, initial encounter: Secondary | ICD-10-CM | POA: Insufficient documentation

## 2018-06-29 DIAGNOSIS — S93422A Sprain of deltoid ligament of left ankle, initial encounter: Secondary | ICD-10-CM | POA: Insufficient documentation

## 2018-06-29 DIAGNOSIS — Y9289 Other specified places as the place of occurrence of the external cause: Secondary | ICD-10-CM | POA: Insufficient documentation

## 2018-06-29 DIAGNOSIS — Y9389 Activity, other specified: Secondary | ICD-10-CM | POA: Insufficient documentation

## 2018-06-29 DIAGNOSIS — Z87891 Personal history of nicotine dependence: Secondary | ICD-10-CM | POA: Insufficient documentation

## 2018-06-29 DIAGNOSIS — Z79899 Other long term (current) drug therapy: Secondary | ICD-10-CM | POA: Insufficient documentation

## 2018-06-29 NOTE — ED Triage Notes (Signed)
Pt C/O right shoulder pain and left ankle pain. Pt denies falls or injury. No deformity noted

## 2018-06-29 NOTE — ED Provider Notes (Signed)
[facility] EMERGENCY DEPARTMENT Provider Note   CSN: [id number] Arrival date & time: 06/29/18  0533  Time seen 05:50 AM   History   Chief Complaint Chief Complaint  Patient presents with  . Multiple complaints    HPI Aaron Gutierrez is a 27 y.o. male.  HPI patient states 2 to 3 weeks ago he was getting out of a semitruck and he stepped wrong and twisted his left ankle.  He states he has been wrapping it and walking on it however it still hurts when he tries to move it in certain ways.  He did not seek any medical care for it but states his work was aware of the injury.  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  He states when he reached up to check the monitor to mark off he had picked up the load he had some pain in his right shoulder.  He states it hurts when he moves his arm.  He has been taking Tylenol 500 mg maybe twice a day because "I do not like to take pills".  He states he is right-handed.  He denies any new numbness or tingling in his fingers.  PCP Patient, No Pcp Per   History reviewed. No pertinent past medical history.  There are no active problems to display for this patient.   History reviewed. No pertinent surgical history.      Home Medications    Prior to Admission medications   Medication Sig Start Date End Date Taking? Authorizing Provider  dicyclomine (BENTYL) 20 MG tablet Take 1 tablet (20 mg total) by mouth every 6 (six) hours as needed for spasms (abdominal cramping). 03/23/16   Samuel JesterMcManus, Kathleen, DO  famotidine (PEPCID) 20 MG tablet Take 1 tablet (20 mg total) by mouth 2 (two) times daily. 03/23/16   Samuel JesterMcManus, Kathleen, DO  ondansetron (ZOFRAN) 4 MG tablet Take 1 tablet (4 mg total) by mouth every 8 (eight) hours as needed for nausea or vomiting. 03/23/16   Samuel JesterMcManus, Kathleen, DO    Family History No family history on  file.  Social History Social History   Tobacco Use  . Smoking status: Former Games developermoker  . Smokeless tobacco: Former Engineer, waterUser  Substance Use Topics  . Alcohol use: No  . Drug use: No  employed Drinks occassionally   Allergies   Patient has no known allergies.   Review of Systems Review of Systems  All other systems reviewed and are negative.    Physical Exam Updated Vital Signs BP (!) 131/94 (BP Location: Left Arm)   Pulse 88   Temp 98.2 F (36.8 C) (Oral)   Resp 18   Ht 6\' 2"  (1.88 m)   Wt 108.9 kg   SpO2 100%   BMI 30.81 kg/m   Vital signs normal    Physical Exam  Constitutional: He is oriented to person, place, and time. He appears well-developed and well-nourished. No distress.  HENT:  Head: Normocephalic and atraumatic.  Right Ear: External ear normal.  Left Ear: External ear normal.  Nose: Nose normal.  Eyes: Conjunctivae and EOM are normal.  Neck: Normal range of motion.  Cardiovascular: Normal rate.  Pulmonary/Chest: Effort normal. No respiratory distress.  Musculoskeletal: Normal range of motion. He exhibits edema and tenderness.  Patient has some diffuse tenderness of his right shoulder but he has a lot of  pain when he abducts his arm.  It seems to make it hurt anterior to the shoulder and then goes back into his upper back.  There is no obvious swelling or crepitance felt.  On exam of his left ankle he appears to have some mild left swelling over the lateral malleolus with some tenderness inferior to the left lateral malleolus to palpation.  He is nontender over the medial malleolus, foot, the distal left lower leg or left knee.  There is no joint effusions noted.  Neurological: He is alert and oriented to person, place, and time. No cranial nerve deficit.  Skin: Skin is warm and dry. No rash noted. No erythema.  Psychiatric: He has a normal mood and affect. His behavior is normal. Thought content normal.  Nursing note and vitals reviewed.    ED  Treatments / Results  Labs (all labs ordered are listed, but only abnormal results are displayed) Labs Reviewed - No data to display  EKG None  Radiology Dg Shoulder Right  Result Date: 06/29/2018 CLINICAL DATA:  Right shoulder pain.  No injury. EXAM: RIGHT SHOULDER - 2+ VIEW COMPARISON:  None. FINDINGS: There is no evidence of fracture or dislocation. There is no evidence of arthropathy or other focal bone abnormality. Soft tissues are unremarkable. IMPRESSION: Negative. Electronically Signed   By: Burman NievesWilliam  Stevens M.D.   On: 06/29/2018 06:34   Dg Ankle Complete Left  Result Date: 06/29/2018 CLINICAL DATA:  Anterior ankle pain since Saturday.  No injury. EXAM: LEFT ANKLE COMPLETE - 3+ VIEW COMPARISON:  None. FINDINGS: There is no evidence of fracture, dislocation, or joint effusion. There is no evidence of arthropathy or other focal bone abnormality. Soft tissues are unremarkable. IMPRESSION: Negative. Electronically Signed   By: Burman NievesWilliam  Stevens M.D.   On: 06/29/2018 06:34    Procedures Procedures (including critical care time)  Medications Ordered in ED Medications - No data to display   Initial Impression / Assessment and Plan / ED Course  I have reviewed the triage vital signs and the nursing notes.  Pertinent labs & imaging results that were available during my care of the patient were reviewed by me and considered in my medical decision making (see chart for details).     X-rays were ordered to look for chip fractures, signs of arthritis, other abnormal soft tissues.  6:20 AM I looked at patient's x-rays, I do not see any obvious fractures or chip fractures.  He was placed in ASO.  He was advised to take ibuprofen 600 mg 4 times a day, use ice and heat for comfort, he can either follow-up at the urgent care in [place] if he goes through [employer]. or if he wants to pay out of pocket he can go see the orthopedist in town.  06:39 AM his xrays have been read by the  radiologist, pt was discharged with sprain of his left ankle and probable rotator cuff injury to his right shoulder.    Final Clinical Impressions(s) / ED Diagnoses   Final diagnoses:  Sprain of left medial ankle joint, initial encounter  Injury of right rotator cuff, initial encounter    ED Discharge Orders    None    OTC ibuprofen   Plan discharge  Devoria AlbeIva Kaylon Hitz, MD, Concha PyoFACEP     Joshue Badal, MD 06/29/18 [phone]

## 2018-06-29 NOTE — Discharge Instructions (Signed)
Use ice and heat for comfort. Wear the ankle support to promote healing. Take ibuprofen 600 mg 4 times a day for pain. You can either follow up with Dr Wende [facility]Guarino at [facility] if you use your workman's comp or you can be seen by Dr Hilda LiasKeeling, an orthopedist if you decide you want to pay for your medical care.

## 2018-08-31 ENCOUNTER — Encounter (HOSPITAL_COMMUNITY): Payer: Self-pay | Admitting: Emergency Medicine

## 2018-08-31 ENCOUNTER — Other Ambulatory Visit: Payer: Self-pay

## 2018-08-31 ENCOUNTER — Emergency Department (HOSPITAL_COMMUNITY)
Admission: EM | Admit: 2018-08-31 | Discharge: 2018-08-31 | Disposition: A | Discharge status: Home / Self Care | Payer: Self-pay | Attending: Emergency Medicine | Admitting: Emergency Medicine

## 2018-08-31 DIAGNOSIS — R030 Elevated blood-pressure reading, without diagnosis of hypertension: Secondary | ICD-10-CM | POA: Insufficient documentation

## 2018-08-31 DIAGNOSIS — Z79899 Other long term (current) drug therapy: Secondary | ICD-10-CM | POA: Insufficient documentation

## 2018-08-31 DIAGNOSIS — Z87891 Personal history of nicotine dependence: Secondary | ICD-10-CM | POA: Insufficient documentation

## 2018-08-31 LAB — BASIC METABOLIC PANEL
ANION GAP: 7 (ref 5–15)
BUN: 10 mg/dL (ref 6–20)
CHLORIDE: 106 mmol/L (ref 98–111)
CO2: 25 mmol/L (ref 22–32)
Calcium: 9.5 mg/dL (ref 8.9–10.3)
Creatinine, Ser: 0.66 mg/dL (ref 0.61–1.24)
GFR calc non Af Amer: >60 mL/min (ref >60)
Glucose, Bld: 85 mg/dL (ref 70–99)
Potassium: 3.4 mmol/L — ABNORMAL LOW (ref 3.5–5.1)
Sodium: 138 mmol/L (ref 135–145)

## 2018-08-31 LAB — CBC WITH DIFFERENTIAL/PLATELET
Abs Immature Granulocytes: 0.03 10*3/uL (ref 0.00–0.07)
BASOS ABS: 0 10*3/uL (ref 0.0–0.1)
Basophils Relative: 0 %
EOS PCT: 2 %
Eosinophils Absolute: 0.2 10*3/uL (ref 0.0–0.5)
HEMATOCRIT: 45.8 % (ref 39.0–52.0)
HEMOGLOBIN: 14.7 g/dL (ref 13.0–17.0)
IMMATURE GRANULOCYTES: 0 %
LYMPHS ABS: 2.9 10*3/uL (ref 0.7–4.0)
LYMPHS PCT: 40 %
MCH: 29.1 pg (ref 26.0–34.0)
MCHC: 32.1 g/dL (ref 30.0–36.0)
MCV: 90.5 fL (ref 80.0–100.0)
MONOS PCT: 6 %
Monocytes Absolute: 0.4 10*3/uL (ref 0.1–1.0)
NEUTROS PCT: 52 %
NRBC: 0 % (ref 0.0–0.2)
Neutro Abs: 3.7 10*3/uL (ref 1.7–7.7)
Platelets: 274 10*3/uL (ref 150–400)
RBC: 5.06 MIL/uL (ref 4.22–5.81)
RDW: 11.9 % (ref 11.5–15.5)
WBC: 7.2 10*3/uL (ref 4.0–10.5)

## 2018-08-31 NOTE — Discharge Instructions (Addendum)
Your evaluated today for high blood pressure.  Your lab work and EKG were negative.  You do not have any high blood pressure while in the emergency department.  I would suggest follow with your primary care doctor for evaluation.  Return to the ED for any new or worsening symptoms.

## 2018-08-31 NOTE — ED Triage Notes (Signed)
Pt states he felt hot and clammy at work. A nurse took his BP and it was 180/120. Denies hx of HTN. Denies HA, dizziness, n/v.

## 2018-08-31 NOTE — ED Provider Notes (Signed)
[facility] EMERGENCY DEPARTMENT Provider Note   CSN: [id number] Arrival date & time: 08/31/18  1722     History   Chief Complaint Chief Complaint  Patient presents with  . Hypertension    HPI Aaron Gutierrez is a 27 y.o. male with no past medical history who presents for evaluation of hypertension.  Patient states he was at work and he felt clammy.  He went to his work nurse to be evaluated and when she checked his blood pressure was approximately 180/120.  Patient denies history of hypertension or blood glucose issues.  Denies any headache, dizziness, nausea, vomiting, recent illnesses, chest pain, shortness of breath, vision changes or unilateral weakness.  Patient states that since his blood pressure was elevated at work that he needs to be reevaluated before he can return to work tomorrow.  Patient is currently asymptomatic.  HPI  History reviewed. No pertinent past medical history.  There are no active problems to display for this patient.   History reviewed. No pertinent surgical history.      Home Medications    Prior to Admission medications   Medication Sig Start Date End Date Taking? Authorizing Provider  dicyclomine (BENTYL) 20 MG tablet Take 1 tablet (20 mg total) by mouth every 6 (six) hours as needed for spasms (abdominal cramping). 03/23/16   Samuel Jester, DO  famotidine (PEPCID) 20 MG tablet Take 1 tablet (20 mg total) by mouth 2 (two) times daily. 03/23/16   Samuel Jester, DO  ondansetron (ZOFRAN) 4 MG tablet Take 1 tablet (4 mg total) by mouth every 8 (eight) hours as needed for nausea or vomiting. 03/23/16   Samuel Jester, DO    Family History No family history on file.  Social History Social History   Tobacco Use  . Smoking status: Former Games developer  . Smokeless tobacco: Current User    Types: Snuff  Substance Use Topics  . Alcohol use: Yes    Comment: socially   . Drug use: No     Allergies   Patient has no known  allergies.   Review of Systems Review of Systems  Constitutional: Negative for activity change, appetite change, chills, diaphoresis, fatigue, fever and unexpected weight change.  Respiratory: Negative.   Cardiovascular: Negative.   Gastrointestinal: Negative.   Musculoskeletal: Negative.   Skin: Negative.   Neurological: Negative.      Physical Exam Updated Vital Signs BP 125/89   Pulse 69   Temp 98.2 F (36.8 C) (Oral)   Resp 16   Ht 6' (1.829 m)   Wt 113.4 kg   SpO2 99%   BMI 33.91 kg/m   Physical Exam  Constitutional: He appears well-developed and well-nourished. No distress.  HENT:  Head: Normocephalic and atraumatic.  Mouth/Throat: Oropharynx is clear and moist.  Eyes: Pupils are equal, round, and reactive to light.  Neck: Normal range of motion. Neck supple. No JVD present. No tracheal deviation present.  Cardiovascular: Normal rate, regular rhythm, normal heart sounds and intact distal pulses. Exam reveals no gallop and no friction rub.  No murmur heard. Pulmonary/Chest: Effort normal and breath sounds normal. No stridor. No respiratory distress. He has no wheezes. He has no rales. He exhibits no tenderness.  Abdominal: Soft. Bowel sounds are normal. He exhibits no distension. There is no tenderness.  Musculoskeletal: Normal range of motion.  Neurological: He is alert. No cranial nerve deficit. Coordination normal.  Skin: Skin is warm and dry. He is not diaphoretic.  Psychiatric: He has a normal mood  and affect.  Nursing note and vitals reviewed.    ED Treatments / Results  Labs (all labs ordered are listed, but only abnormal results are displayed) Labs Reviewed  BASIC METABOLIC PANEL - Abnormal; Notable for the following components:      Result Value   Potassium 3.4 (*)    All other components within normal limits  CBC WITH DIFFERENTIAL/PLATELET    EKG EKG Interpretation  Date/Time:  Wednesday August 31 2018 17:56:42 EDT Ventricular Rate:   70 PR Interval:    QRS Duration: 88 QT Interval:  354 QTC Calculation: 382 R Axis:   61 Text Interpretation:  Sinus rhythm Confirmed by Donnetta Hutching ([id number]) on 08/31/2018 11:39:54 PM   Radiology No results found.  Procedures Procedures (including critical care time)  Medications Ordered in ED Medications - No data to display   Initial Impression / Assessment and Plan / ED Course  I have reviewed the triage vital signs and the nursing notes.  Pertinent labs & imaging results that were available during my care of the patient were reviewed by me and considered in my medical decision making (see chart for details).  27 year old otherwise well-appearing male presents for evaluation of hypertension.  AFebrile, nonseptic, non-ill-appearing.  Episode of "clamminess" at work and was evaluated to have a blood pressure 180/120.  Denies any additional symptoms, is currently asymptomatic at this time.  Blood pressure at triage 118/44. Will obtain EKG, baseline labs and reevaluate.      EKG normal sinus rhythm.  Labs without leukocytosis.  Mild hypokalemia 3.4, however does not want supplementation at this time.  Denies diuretic use.  Requesting DC home at this time.  No signs of hypertensive urgency. Discussed with patient he should have follow-up with his primary care provider for reevaluation of his blood pressure.  He has had multiple normotensive blood pressures while in the department.  Remains asymptomatic.  Discussed with patient reasons to return to the emergency department.  Patient voiced understanding is agreeable for follow-up.   Final Clinical Impressions(s) / ED Diagnoses   Final diagnoses:  Temporary high blood pressure    ED Discharge Orders    None       Henderly, Britni A, PA-C 09/01/18 0047    Donnetta Hutching, MD 09/02/18 1157

## 2021-08-06 ENCOUNTER — Emergency Department (HOSPITAL_COMMUNITY)
Admission: EM | Admit: 2021-08-06 | Discharge: 2021-08-07 | Disposition: A | Discharge status: Home / Self Care | Payer: No Typology Code available for payment source | Attending: Emergency Medicine | Admitting: Emergency Medicine

## 2021-08-06 ENCOUNTER — Encounter (HOSPITAL_COMMUNITY): Payer: Self-pay

## 2021-08-06 ENCOUNTER — Other Ambulatory Visit: Payer: Self-pay

## 2021-08-06 DIAGNOSIS — M546 Pain in thoracic spine: Secondary | ICD-10-CM

## 2021-08-06 DIAGNOSIS — Z87891 Personal history of nicotine dependence: Secondary | ICD-10-CM | POA: Diagnosis not present

## 2021-08-06 DIAGNOSIS — Y990 Civilian activity done for income or pay: Secondary | ICD-10-CM | POA: Diagnosis not present

## 2021-08-06 DIAGNOSIS — X500XXA Overexertion from strenuous movement or load, initial encounter: Secondary | ICD-10-CM | POA: Diagnosis not present

## 2021-08-06 NOTE — ED Triage Notes (Signed)
Pt. Hurt their back lifting a lift gate at work.

## 2021-08-07 MED ORDER — NAPROXEN 250 MG PO TABS
500.0000 mg | ORAL_TABLET | Freq: Once | ORAL | Status: AC
  Administered 2021-08-07: 500 mg via ORAL
  Filled 2021-08-07: qty 2

## 2021-08-07 MED ORDER — METHOCARBAMOL 500 MG PO TABS: 500.0000 mg | ORAL_TABLET | Freq: Three times a day (TID) | ORAL | 0 refills | Status: DC | PRN

## 2021-08-07 MED ORDER — NAPROXEN 500 MG PO TABS: 500.0000 mg | ORAL_TABLET | Freq: Two times a day (BID) | ORAL | 0 refills | Status: DC

## 2021-08-07 MED ORDER — LIDOCAINE 5 % EX PTCH: 1.0000 | MEDICATED_PATCH | CUTANEOUS | 0 refills | Status: DC

## 2021-08-07 NOTE — ED Provider Notes (Signed)
[facility]-EMERGENCY DEPT [facility] Emergency Department Provider Note MRN:  [id number]  Arrival date & time: 08/07/21     Chief Complaint   Back Pain   History of Present Illness   Aaron Gutierrez is a 30 y.o. year-old male with no pertinent PMH presenting to the ED with chief complaint of back pain.  Location:  left thoracic Duration:  a few hours Onset:  sudden when lifting heavy object at work Timing:  constant pain Description:  sharp Severity:  severe Exacerbating/Alleviating Factors:  worse with movement Associated Symptoms:  none Pertinent Negatives:  no numbness or weakness to arms or legs, no bowel or bladder dysfunction, no trauma  Additional History:  none  Review of Systems  A complete 10 system review of systems was obtained and all systems are negative except as noted in the HPI and PMH.   Patient's Health History   History reviewed. No pertinent past medical history.  History reviewed. No pertinent surgical history.  History reviewed. No pertinent family history.  Social History   Socioeconomic History   Marital status: Single    Spouse name: Not on file   Number of children: Not on file   Years of education: Not on file   Highest education level: Not on file  Occupational History   Not on file  Tobacco Use   Smoking status: Former   Smokeless tobacco: Current    Types: Snuff  Vaping Use   Vaping Use: Never used  Substance and Sexual Activity   Alcohol use: Yes    Comment: socially    Drug use: No   Sexual activity: Not on file  Other Topics Concern   Not on file  Social History Narrative   Not on file   Social Determinants of Health   Financial Resource Strain: Not on file  Food Insecurity: Not on file  Transportation Needs: Not on file  Physical Activity: Not on file  Stress: Not on file  Social Connections: Not on file  Intimate Partner Violence: Not on file     Physical Exam   Vitals:   08/06/21 2152  BP: (!) 131/96   Pulse: 87  Resp: 17  Temp: 98.3 F (36.8 C)  SpO2: 100%    CONSTITUTIONAL:  well-appearing, NAD NEURO:  Alert and oriented x 3, no focal deficits EYES:  eyes equal and reactive ENT/NECK:  no LAD, no JVD CARDIO:  regular rate, well-perfused, normal S1 and S2 PULM:  CTAB no wheezing or rhonchi GI/GU:  normal bowel sounds, non-distended, non-tender MSK/SPINE:  No gross deformities, no edema SKIN:  no rash, atraumatic PSYCH:  Appropriate speech and behavior  *Additional and/or pertinent findings included in MDM below  Diagnostic and Interventional Summary    EKG Interpretation  Date/Time:    Ventricular Rate:    PR Interval:    QRS Duration:   QT Interval:    QTC Calculation:   R Axis:     Text Interpretation:         Labs Reviewed - No data to display  No orders to display    Medications  naproxen (NAPROSYN) tablet 500 mg (500 mg Oral Given 08/07/21 0051)     Procedures  /  Critical Care Procedures  ED Course and Medical Decision Making  I have reviewed the triage vital signs, the nursing notes, and pertinent available records from the EMR.  Listed above are laboratory and imaging tests that I personally ordered, reviewed, and interpreted and then considered in my  medical decision making (see below for details).  Tender musculature of the thoracic back.  No trauma and no midline tenderness, no indication for imaging.  No red flags to suggest myelopathy.  Appropriate for dc with symptomatic management.       Elmer Sow. Pilar Plate, MD [facility] Emergency Medicine [facility] [email or web address]  Final Clinical Impressions(s) / ED Diagnoses     ICD-10-CM   1. Acute left-sided thoracic back pain  M54.6       ED Discharge Orders          Ordered    lidocaine (LIDODERM) 5 %  Every 24 hours        08/07/21 0058    methocarbamol (ROBAXIN) 500 MG tablet  Every 8 hours PRN        08/07/21 0058    naproxen (NAPROSYN) 500 MG tablet  2 times daily         08/07/21 0058             Discharge Instructions Discussed with and Provided to Patient:    Discharge Instructions      You were evaluated in the Emergency Department and after careful evaluation, we did not find any emergent condition requiring admission or further testing in the hospital.  Your exam/testing today was overall reassuring.  Please return to the Emergency Department if you experience any worsening of your condition.  Thank you for allowing [place] to be a part of your care.        Sabas Sous, MD 08/07/21 [phone]

## 2021-08-07 NOTE — Discharge Instructions (Signed)
You were evaluated in the Emergency Department and after careful evaluation, we did not find any emergent condition requiring admission or further testing in the hospital.  Your exam/testing today was overall reassuring.  Please return to the Emergency Department if you experience any worsening of your condition.  Thank you for allowing us to be a part of your care.  

## 2021-08-11 ENCOUNTER — Encounter (HOSPITAL_COMMUNITY): Payer: Self-pay | Admitting: *Deleted

## 2021-08-11 ENCOUNTER — Other Ambulatory Visit: Payer: Self-pay

## 2021-08-11 ENCOUNTER — Emergency Department (HOSPITAL_COMMUNITY)
Admission: EM | Admit: 2021-08-11 | Discharge: 2021-08-11 | Disposition: A | Discharge status: Home / Self Care | Payer: No Typology Code available for payment source | Attending: Emergency Medicine | Admitting: Emergency Medicine

## 2021-08-11 DIAGNOSIS — Y990 Civilian activity done for income or pay: Secondary | ICD-10-CM | POA: Diagnosis not present

## 2021-08-11 DIAGNOSIS — M5442 Lumbago with sciatica, left side: Secondary | ICD-10-CM | POA: Insufficient documentation

## 2021-08-11 DIAGNOSIS — M545 Low back pain, unspecified: Secondary | ICD-10-CM | POA: Diagnosis present

## 2021-08-11 DIAGNOSIS — M5432 Sciatica, left side: Secondary | ICD-10-CM

## 2021-08-11 DIAGNOSIS — Z87891 Personal history of nicotine dependence: Secondary | ICD-10-CM | POA: Diagnosis not present

## 2021-08-11 MED ORDER — PREDNISONE 10 MG PO TABS: ORAL_TABLET | ORAL | 0 refills | Status: DC

## 2021-08-11 MED ORDER — OXYCODONE-ACETAMINOPHEN 5-325 MG PO TABS: 1.0000 | ORAL_TABLET | Freq: Four times a day (QID) | ORAL | 0 refills | Status: DC | PRN

## 2021-08-11 NOTE — Discharge Instructions (Addendum)
Continue taking your methocarbamol as directed.  Apply ice packs on and off to your back and left shoulder blade area.  Call [facility] this week to make a follow-up appointment.  Return to the emergency department for any new or worsening symptoms.

## 2021-08-11 NOTE — ED Triage Notes (Signed)
Back pain

## 2021-08-12 NOTE — ED Provider Notes (Signed)
[facility] EMERGENCY DEPARTMENT Provider Note   CSN: [id number] Arrival date & time: 08/11/21  1428     History Chief Complaint  Patient presents with   Back Pain    Aaron Gutierrez is a 30 y.o. male.   Back Pain Associated symptoms: no abdominal pain, no chest pain, no dysuria, no fever, no numbness and no weakness        Aaron Gutierrez is a 30 y.o. male who presents to the Emergency Department complaining of left-sided low back pain for several days.  He describes a constant aching pain of his left lower back that radiates to his left buttock and  lateral thigh.  Pain is worse with walking or standing.  Improves slightly at rest.  He states that he performs strenuous activities at his job and he is unsure if he may have injured his back.  He denies fever, chills, abdominal pain, urine or bowel changes, weakness or numbness of his lower extremities, and pain or swelling of his genitals. .     History reviewed. No pertinent past medical history.  There are no problems to display for this patient.   History reviewed. No pertinent surgical history.     No family history on file.  Social History   Tobacco Use   Smoking status: Former   Smokeless tobacco: Current    Types: Snuff  Vaping Use   Vaping Use: Never used  Substance Use Topics   Alcohol use: Yes    Comment: socially    Drug use: No    Home Medications Prior to Admission medications   Medication Sig Start Date End Date Taking? Authorizing Provider  oxyCODONE-acetaminophen (PERCOCET/ROXICET) 5-325 MG tablet Take 1 tablet by mouth every 6 (six) hours as needed for severe pain. 08/11/21  Yes Emiko Osorto, PA-C  oxyCODONE-acetaminophen (PERCOCET/ROXICET) 5-325 MG tablet Take 1 tablet by mouth every 6 (six) hours as needed for severe pain. 08/11/21  Yes Luz Mares, PA-C  predniSONE (DELTASONE) 10 MG tablet Take 6 tablets day one, 5 tablets day two, 4 tablets day three, 3 tablets day four, 2 tablets  day five, then 1 tablet day six 08/11/21  Yes Alexsus Papadopoulos, PA-C  dicyclomine (BENTYL) 20 MG tablet Take 1 tablet (20 mg total) by mouth every 6 (six) hours as needed for spasms (abdominal cramping). 03/23/16   Samuel Jester, DO  famotidine (PEPCID) 20 MG tablet Take 1 tablet (20 mg total) by mouth 2 (two) times daily. 03/23/16   Samuel Jester, DO  lidocaine (LIDODERM) 5 % Place 1 patch onto the skin daily. Remove & Discard patch within 12 hours or as directed by MD 08/07/21   Sabas Sous, MD  methocarbamol (ROBAXIN) 500 MG tablet Take 1 tablet (500 mg total) by mouth every 8 (eight) hours as needed for muscle spasms. 08/07/21   Sabas Sous, MD  naproxen (NAPROSYN) 500 MG tablet Take 1 tablet (500 mg total) by mouth 2 (two) times daily. 08/07/21   Sabas Sous, MD  ondansetron (ZOFRAN) 4 MG tablet Take 1 tablet (4 mg total) by mouth every 8 (eight) hours as needed for nausea or vomiting. 03/23/16   Samuel Jester, DO    Allergies    Patient has no known allergies.  Review of Systems   Review of Systems  Constitutional:  Negative for chills, fatigue and fever.  Respiratory:  Negative for cough and shortness of breath.   Cardiovascular:  Negative for chest pain.  Gastrointestinal:  Negative for  abdominal pain, nausea and vomiting.  Genitourinary:  Negative for difficulty urinating, dysuria, flank pain, hematuria, penile swelling, scrotal swelling and testicular pain.  Musculoskeletal:  Positive for back pain. Negative for arthralgias, myalgias, neck pain and neck stiffness.  Skin:  Negative for rash.  Neurological:  Negative for dizziness, weakness and numbness.  Hematological:  Does not bruise/bleed easily.   Physical Exam Updated Vital Signs BP (!) 140/92 (BP Location: Right Arm)   Pulse 70   Temp 98.4 F (36.9 C)   Resp 17   SpO2 99%   Physical Exam Vitals and nursing note reviewed.  Constitutional:      Appearance: Normal appearance. He is not ill-appearing or  toxic-appearing.  Cardiovascular:     Rate and Rhythm: Normal rate and regular rhythm.     Pulses: Normal pulses.  Pulmonary:     Effort: Pulmonary effort is normal.     Breath sounds: Normal breath sounds. No wheezing.  Abdominal:     Palpations: Abdomen is soft.     Tenderness: There is no abdominal tenderness. There is no right CVA tenderness or left CVA tenderness.  Musculoskeletal:        General: Tenderness present.     Cervical back: Full passive range of motion without pain.     Lumbar back: Spasms and tenderness present. No swelling or bony tenderness. Positive right straight leg raise test. Negative left straight leg raise test.     Right lower leg: No edema.     Left lower leg: No edema.     Comments: Ttp of the left lumbar paraspinal muscles.  No midline tenderness.  Positive SLR on left at 30 degrees.  Hip flexors and extensors intact  Skin:    General: Skin is warm.     Capillary Refill: Capillary refill takes less than 2 seconds.     Findings: No rash.  Neurological:     General: No focal deficit present.     Mental Status: He is alert.     Sensory: Sensation is intact. No sensory deficit.     Motor: No weakness.     Coordination: Coordination is intact.    ED Results / Procedures / Treatments   Labs (all labs ordered are listed, but only abnormal results are displayed) Labs Reviewed - No data to display  EKG None  Radiology No results found.   Procedures Procedures   Medications Ordered in ED Medications - No data to display  ED Course  I have reviewed the triage vital signs and the nursing notes.  Pertinent labs & imaging results that were available during my care of the patient were reviewed by me and considered in my medical decision making (see chart for details).    MDM Rules/Calculators/A&P                           Patient here for eval ration of left-sided low back pain with pain into the buttock and left upper leg.  He was seen here  recently for same and states symptoms are not improving with NSAID and muscle relaxer.  No worsening pain or new symptoms.  He does have tenderness to the lumbar paraspinal muscles.  No red flags to suggest infectious process or cauda equina.  No foot drop.  No reported trauma to suggest need for imaging at this time.  I feel this is likely a sciatica, discussed treatment plan to include steroid and he will continue his  methocarbamol.  Will provide short course of pain medication as well along with referral information to orthopedics if not improving.  Patient agreeable to plan, all questions answered.  He appears appropriate for discharge home   Final Clinical Impression(s) / ED Diagnoses Final diagnoses:  Sciatica of left side    Rx / DC Orders ED Discharge Orders          Ordered    oxyCODONE-acetaminophen (PERCOCET/ROXICET) 5-325 MG tablet  Every 6 hours PRN        08/11/21 2004    predniSONE (DELTASONE) 10 MG tablet        08/11/21 2004    oxyCODONE-acetaminophen (PERCOCET/ROXICET) 5-325 MG tablet  Every 6 hours PRN        08/11/21 2006             Pauline Aus, PA-C 08/12/21 1522    Bethann Berkshire, MD 08/13/21 2129

## 2021-08-13 ENCOUNTER — Other Ambulatory Visit: Payer: Self-pay

## 2021-08-13 ENCOUNTER — Encounter: Payer: Self-pay | Admitting: Emergency Medicine

## 2021-08-13 ENCOUNTER — Ambulatory Visit
Admission: EM | Admit: 2021-08-13 | Discharge: 2021-08-13 | Disposition: A | Discharge status: Home / Self Care | Payer: No Typology Code available for payment source

## 2021-08-13 DIAGNOSIS — M545 Low back pain, unspecified: Secondary | ICD-10-CM

## 2021-08-13 MED FILL — Oxycodone w/ Acetaminophen Tab 5-325 MG: ORAL | Qty: 6 | Status: AC

## 2021-08-13 NOTE — ED Triage Notes (Signed)
Hurt back at work on Wednesday.  Mid back pain and left side hip pain.  Hurts to sit for a period of time.  Hurts to move knee up makes back hurt.

## 2021-08-19 ENCOUNTER — Encounter (HOSPITAL_COMMUNITY): Payer: Self-pay | Admitting: *Deleted

## 2021-08-19 ENCOUNTER — Emergency Department (HOSPITAL_COMMUNITY): Payer: No Typology Code available for payment source

## 2021-08-19 ENCOUNTER — Emergency Department (HOSPITAL_COMMUNITY)
Admission: EM | Admit: 2021-08-19 | Discharge: 2021-08-19 | Disposition: A | Discharge status: Home / Self Care | Payer: No Typology Code available for payment source | Attending: Emergency Medicine | Admitting: Emergency Medicine

## 2021-08-19 ENCOUNTER — Other Ambulatory Visit: Payer: Self-pay

## 2021-08-19 DIAGNOSIS — R209 Unspecified disturbances of skin sensation: Secondary | ICD-10-CM | POA: Insufficient documentation

## 2021-08-19 DIAGNOSIS — M79651 Pain in right thigh: Secondary | ICD-10-CM | POA: Diagnosis not present

## 2021-08-19 DIAGNOSIS — S39012D Strain of muscle, fascia and tendon of lower back, subsequent encounter: Secondary | ICD-10-CM | POA: Insufficient documentation

## 2021-08-19 DIAGNOSIS — R531 Weakness: Secondary | ICD-10-CM | POA: Diagnosis not present

## 2021-08-19 DIAGNOSIS — M79652 Pain in left thigh: Secondary | ICD-10-CM | POA: Insufficient documentation

## 2021-08-19 DIAGNOSIS — Y990 Civilian activity done for income or pay: Secondary | ICD-10-CM | POA: Diagnosis not present

## 2021-08-19 DIAGNOSIS — X500XXD Overexertion from strenuous movement or load, subsequent encounter: Secondary | ICD-10-CM | POA: Insufficient documentation

## 2021-08-19 DIAGNOSIS — Z87891 Personal history of nicotine dependence: Secondary | ICD-10-CM | POA: Insufficient documentation

## 2021-08-19 DIAGNOSIS — S3992XD Unspecified injury of lower back, subsequent encounter: Secondary | ICD-10-CM | POA: Diagnosis present

## 2021-08-19 MED ORDER — KETOROLAC TROMETHAMINE 30 MG/ML IJ SOLN
30.0000 mg | Freq: Once | INTRAMUSCULAR | Status: AC
Start: 2021-08-19 — End: 2021-08-19
  Administered 2021-08-19: 30 mg via INTRAMUSCULAR
  Filled 2021-08-19: qty 1

## 2021-08-19 MED ORDER — MELOXICAM 7.5 MG PO TABS: 7.5000 mg | ORAL_TABLET | Freq: Every day | ORAL | 0 refills | Status: DC

## 2021-08-19 NOTE — ED Provider Notes (Signed)
[facility] EMERGENCY DEPARTMENT Provider Note   CSN: [id number] Arrival date & time: 08/19/21  1004     History Chief Complaint  Patient presents with   Back Pain    Aaron Gutierrez is a 30 y.o. male.   Back Pain Associated symptoms: numbness and weakness   Associated symptoms: no abdominal pain, no chest pain, no dysuria and no fever     Presents for low back pain. Started 08/11/21 - was moving heavy objects at work (chicken coops, 200 lb appliances, etc) - pain started acutely. Worse with standing and sitting. Radiates down left buttocks. Now also radiates down right anterior thigh. States starting feeling numbness in the pelvic area that is intermittent. Denise bilateral leg numbness or urinary incontinence. Pain is 8/10. Was seen 08/11/21 and started on oxycodone, msk relaxer and prednisone. Pain has not improved and is worsening.   No prior spinal surgeries, IV drug use, h/o malignancy.   History reviewed. No pertinent past medical history.  There are no problems to display for this patient.   History reviewed. No pertinent surgical history.     No family history on file.  Social History   Tobacco Use   Smoking status: Former   Smokeless tobacco: Current    Types: Snuff  Vaping Use   Vaping Use: Never used  Substance Use Topics   Alcohol use: Yes    Comment: socially    Drug use: No    Home Medications Prior to Admission medications   Medication Sig Start Date End Date Taking? Authorizing Provider  dicyclomine (BENTYL) 20 MG tablet Take 1 tablet (20 mg total) by mouth every 6 (six) hours as needed for spasms (abdominal cramping). 03/23/16   Samuel Jester, DO  famotidine (PEPCID) 20 MG tablet Take 1 tablet (20 mg total) by mouth 2 (two) times daily. 03/23/16   Samuel Jester, DO  lidocaine (LIDODERM) 5 % Place 1 patch onto the skin daily. Remove & Discard patch within 12 hours or as directed by MD 08/07/21   Sabas Sous, MD  methocarbamol (ROBAXIN)  500 MG tablet Take 1 tablet (500 mg total) by mouth every 8 (eight) hours as needed for muscle spasms. 08/07/21   Sabas Sous, MD  naproxen (NAPROSYN) 500 MG tablet Take 1 tablet (500 mg total) by mouth 2 (two) times daily. 08/07/21   Sabas Sous, MD  ondansetron (ZOFRAN) 4 MG tablet Take 1 tablet (4 mg total) by mouth every 8 (eight) hours as needed for nausea or vomiting. 03/23/16   Samuel Jester, DO  oxyCODONE-acetaminophen (PERCOCET/ROXICET) 5-325 MG tablet Take 1 tablet by mouth every 6 (six) hours as needed for severe pain. 08/11/21   Triplett, Tammy, PA-C  oxyCODONE-acetaminophen (PERCOCET/ROXICET) 5-325 MG tablet Take 1 tablet by mouth every 6 (six) hours as needed for severe pain. 08/11/21   Triplett, Tammy, PA-C  predniSONE (DELTASONE) 10 MG tablet Take 6 tablets day one, 5 tablets day two, 4 tablets day three, 3 tablets day four, 2 tablets day five, then 1 tablet day six 08/11/21   Triplett, Tammy, PA-C    Allergies    Patient has no known allergies.  Review of Systems   Review of Systems  Constitutional:  Negative for chills and fever.  Eyes:  Negative for pain and visual disturbance.  Respiratory:  Negative for cough and shortness of breath.   Cardiovascular:  Negative for chest pain and palpitations.  Gastrointestinal:  Negative for abdominal pain and vomiting.  Genitourinary:  Negative  for difficulty urinating, dysuria and hematuria.  Musculoskeletal:  Positive for back pain. Negative for arthralgias.       Saddle anesthesia/pelvic numbness   Skin:  Negative for color change and rash.  Neurological:  Positive for weakness and numbness. Negative for dizziness, seizures and syncope.  All other systems reviewed and are negative.  Physical Exam Updated Vital Signs BP (!) 154/96 (BP Location: Right Arm)   Pulse 86   Temp 98.2 F (36.8 C) (Oral)   Resp 17   Ht 6' (1.829 m)   Wt 113.4 kg   SpO2 99%   BMI 33.91 kg/m   Physical Exam Vitals and nursing note reviewed.  Exam conducted with a chaperone present.  Constitutional:      Appearance: Normal appearance. He is obese.  HENT:     Head: Normocephalic and atraumatic.  Eyes:     General: No scleral icterus.       Right eye: No discharge.        Left eye: No discharge.     Extraocular Movements: Extraocular movements intact.     Pupils: Pupils are equal, round, and reactive to light.  Cardiovascular:     Rate and Rhythm: Normal rate and regular rhythm.     Pulses: Normal pulses.     Heart sounds: Normal heart sounds. No murmur heard.   No friction rub. No gallop.     Comments: DP, PT and radial pulses 2+ bilaterally  Pulmonary:     Effort: Pulmonary effort is normal. No respiratory distress.     Breath sounds: Normal breath sounds.  Abdominal:     General: Abdomen is flat. Bowel sounds are normal. There is no distension.     Palpations: Abdomen is soft.     Tenderness: There is no abdominal tenderness.  Musculoskeletal:        General: Tenderness present.     Comments: TTP to lumbar region, midline.   Skin:    General: Skin is warm and dry.     Coloration: Skin is not jaundiced.  Neurological:     Mental Status: He is alert. Mental status is at baseline.     Coordination: Coordination normal.     Comments: CN 3-12 grossly in tact. Grip strength equal bilat. Positive straight leg raise to left. Reports decreased sensation to anterior right thigh compared to left, otherwise no sensation disparity to UE or LE. Plantar flexion and dorsiflexion equal, 4/5 against resistance.     ED Results / Procedures / Treatments   Labs (all labs ordered are listed, but only abnormal results are displayed) Labs Reviewed - No data to display  EKG None  Radiology No results found.  Procedures Procedures   Medications Ordered in ED Medications - No data to display  ED Course  I have reviewed the triage vital signs and the nursing notes.  Pertinent labs & imaging results that were available during  my care of the patient were reviewed by me and considered in my medical decision making (see chart for details).    MDM Rules/Calculators/A&P                           Patient vitals stable, mildly HTN. No neuro deficits beyond decreased sensation to right LE. Able to ambulate, although subjective pain. He does endorse pelvic numbness/saddle anesthesia which is new. He failed outpatient trial of supportive care so will proceed with MRI to r/o cauda equina. Doubt epidural abscess  given no risk factors.   MRI negative. Neuro exam reassuring, patient ambulatory. Stable vital signs, suspect muscle sprain and sciatica. Will discharge with return precautions and orthopedic follow up.   Final Clinical Impression(s) / ED Diagnoses Final diagnoses:  None    Rx / DC Orders ED Discharge Orders     None        Theron Arista, Cordelia Poche 08/19/21 1241    Bethann Berkshire, MD 08/23/21 1038

## 2021-08-19 NOTE — ED Triage Notes (Signed)
Pt c/o lower back pain since last Wednesday when he lifted a chicken coop at work. Pt reports he has been seen by a doctor and given prescription medications which have helped some. Denies numbness/tingling to lower extremities and loss of bowel/bladder control.

## 2021-08-19 NOTE — Discharge Instructions (Addendum)
Finish steroid taper. Discontinue muscle relaxer. Take your remaining oxycodone only as needed for severe pain. After steroid taper if you still have pain you can try mobic twice daily for 5 days. Take with food and water to lower chance of developing ulcers. Follow up with orthopedic surgery for follow up if no improvement. Return if things worsen

## 2021-08-19 NOTE — ED Provider Notes (Signed)
[facility]    CSN: [id number] Arrival date & time: 08/13/21  1704      History   Chief Complaint No chief complaint on file.   HPI Aaron Gutierrez is a 30 y.o. male.   Pt complains of low back pain.  Pt reports pain began after lifting    The history is provided by the patient. No language interpreter was used.  Back Pain  History reviewed. No pertinent past medical history.  There are no problems to display for this patient.   History reviewed. No pertinent surgical history.     Home Medications    Prior to Admission medications   Medication Sig Start Date End Date Taking? Authorizing Provider  dicyclomine (BENTYL) 20 MG tablet Take 1 tablet (20 mg total) by mouth every 6 (six) hours as needed for spasms (abdominal cramping). 03/23/16   Samuel Jester, DO  famotidine (PEPCID) 20 MG tablet Take 1 tablet (20 mg total) by mouth 2 (two) times daily. 03/23/16   Samuel Jester, DO  lidocaine (LIDODERM) 5 % Place 1 patch onto the skin daily. Remove & Discard patch within 12 hours or as directed by MD 08/07/21   Sabas Sous, MD  meloxicam (MOBIC) 7.5 MG tablet Take 1 tablet (7.5 mg total) by mouth daily. 08/19/21   Theron Arista, PA-C  methocarbamol (ROBAXIN) 500 MG tablet Take 1 tablet (500 mg total) by mouth every 8 (eight) hours as needed for muscle spasms. 08/07/21   Sabas Sous, MD  ondansetron (ZOFRAN) 4 MG tablet Take 1 tablet (4 mg total) by mouth every 8 (eight) hours as needed for nausea or vomiting. 03/23/16   Samuel Jester, DO  oxyCODONE-acetaminophen (PERCOCET/ROXICET) 5-325 MG tablet Take 1 tablet by mouth every 6 (six) hours as needed for severe pain. 08/11/21   Triplett, Tammy, PA-C  oxyCODONE-acetaminophen (PERCOCET/ROXICET) 5-325 MG tablet Take 1 tablet by mouth every 6 (six) hours as needed for severe pain. 08/11/21   Triplett, Tammy, PA-C  predniSONE (DELTASONE) 10 MG tablet Take 6 tablets day one, 5 tablets day two, 4 tablets day three, 3  tablets day four, 2 tablets day five, then 1 tablet day six 08/11/21   Pauline Aus, PA-C    Family History History reviewed. No pertinent family history.  Social History Social History   Tobacco Use   Smoking status: Former   Smokeless tobacco: Current    Types: Snuff  Vaping Use   Vaping Use: Never used  Substance Use Topics   Alcohol use: Yes    Comment: socially    Drug use: No     Allergies   Patient has no known allergies.   Review of Systems Review of Systems  Musculoskeletal:  Positive for back pain.  All other systems reviewed and are negative.   Physical Exam Triage Vital Signs ED Triage Vitals  Enc Vitals Group     BP 08/13/21 1928 137/82     Pulse Rate 08/13/21 1928 73     Resp 08/13/21 1928 18     Temp 08/13/21 1928 97.6 F (36.4 C)     Temp Source 08/13/21 1928 Temporal     SpO2 08/13/21 1928 99 %     Weight --      Height --      Head Circumference --      Peak Flow --      Pain Score 08/13/21 1929 7     Pain Loc --      Pain  Edu? --      Excl. in GC? --    No data found.  Updated Vital Signs BP 137/82 (BP Location: Right Arm)   Pulse 73   Temp 97.6 F (36.4 C) (Temporal)   Resp 18   SpO2 99%   Visual Acuity Right Eye Distance:   Left Eye Distance:   Bilateral Distance:    Right Eye Near:   Left Eye Near:    Bilateral Near:     Physical Exam Vitals and nursing note reviewed.  Constitutional:      Appearance: He is well-developed.  HENT:     Head: Normocephalic and atraumatic.  Eyes:     Conjunctiva/sclera: Conjunctivae normal.  Cardiovascular:     Rate and Rhythm: Normal rate and regular rhythm.     Heart sounds: No murmur heard. Pulmonary:     Effort: Pulmonary effort is normal. No respiratory distress.     Breath sounds: Normal breath sounds.  Musculoskeletal:        General: Tenderness present. Normal range of motion.     Cervical back: Neck supple.  Skin:    General: Skin is warm and dry.  Neurological:      General: No focal deficit present.     Mental Status: He is alert.     UC Treatments / Results  Labs (all labs ordered are listed, but only abnormal results are displayed) Labs Reviewed - No data to display  EKG   Radiology MR Lumbar Spine Wo Contrast  Result Date: 08/19/2021 CLINICAL DATA:  Low back pain for approximately 1 week after lifting injury EXAM: MRI LUMBAR SPINE WITHOUT CONTRAST TECHNIQUE: Multiplanar, multisequence MR imaging of the lumbar spine was performed. No intravenous contrast was administered. COMPARISON:  None. FINDINGS: Segmentation: Presumed standard anatomy with the inferior-most well developed disc space designated as L5-S1. Alignment:  Physiologic. Vertebrae:  No fracture, evidence of discitis, or bone lesion. Conus medullaris and cauda equina: Conus extends to the T12-L1 level. Conus and cauda equina appear normal. Paraspinal and other soft tissues: Negative. No paraspinal intramuscular edema. Disc levels: Normal appearance of the lumbar intervertebral discs without disc desiccation or focal disc protrusion. Normal facet joints without significant arthropathy. No facet joint effusions. No foraminal or canal stenosis at any level. IMPRESSION: Normal MRI of the lumbar spine. Electronically Signed   By: Duanne Guess D.O.   On: 08/19/2021 12:15    Procedures Procedures (including critical care time)  Medications Ordered in UC Medications - No data to display  Initial Impression / Assessment and Plan / UC Course  I have reviewed the triage vital signs and the nursing notes.  Pertinent labs & imaging results that were available during my care of the patient were reviewed by me and considered in my medical decision making (see chart for details).      Final Clinical Impressions(s) / UC Diagnoses   Final diagnoses:  Acute low back pain, unspecified back pain laterality, unspecified whether sciatica present   Discharge Instructions   None    ED  Prescriptions   None    PDMP not reviewed this encounter.   Elson Areas, [place] 08/19/21 1528

## 2021-11-08 ENCOUNTER — Encounter (HOSPITAL_COMMUNITY): Payer: Self-pay | Admitting: *Deleted

## 2021-11-08 ENCOUNTER — Emergency Department (HOSPITAL_COMMUNITY)
Admission: EM | Admit: 2021-11-08 | Discharge: 2021-11-08 | Disposition: A | Discharge status: Home / Self Care | Payer: BC Managed Care – PPO | Attending: Emergency Medicine | Admitting: Emergency Medicine

## 2021-11-08 DIAGNOSIS — R509 Fever, unspecified: Secondary | ICD-10-CM | POA: Diagnosis present

## 2021-11-08 DIAGNOSIS — R000 Tachycardia, unspecified: Secondary | ICD-10-CM | POA: Diagnosis not present

## 2021-11-08 DIAGNOSIS — B349 Viral infection, unspecified: Secondary | ICD-10-CM | POA: Insufficient documentation

## 2021-11-08 DIAGNOSIS — Z87891 Personal history of nicotine dependence: Secondary | ICD-10-CM | POA: Diagnosis not present

## 2021-11-08 DIAGNOSIS — Z20822 Contact with and (suspected) exposure to covid-19: Secondary | ICD-10-CM | POA: Diagnosis not present

## 2021-11-08 LAB — RESP PANEL BY RT-PCR (FLU A&B, COVID) ARPGX2
Influenza A by PCR: POSITIVE — AB
Influenza B by PCR: NEGATIVE
SARS Coronavirus 2 by RT PCR: NEGATIVE

## 2021-11-08 MED ORDER — ACETAMINOPHEN 500 MG PO TABS
1000.0000 mg | ORAL_TABLET | Freq: Once | ORAL | Status: AC
  Administered 2021-11-08: 17:00:00 1000 mg via ORAL
  Filled 2021-11-08: qty 2

## 2021-11-08 MED ORDER — ONDANSETRON HCL 4 MG PO TABS: 4.0000 mg | ORAL_TABLET | Freq: Three times a day (TID) | ORAL | 0 refills | Status: DC | PRN

## 2021-11-08 MED ORDER — BENZONATATE 100 MG PO CAPS: 200.0000 mg | ORAL_CAPSULE | Freq: Three times a day (TID) | ORAL | 0 refills | Status: DC | PRN

## 2021-11-08 NOTE — ED Provider Notes (Signed)
[facility] EMERGENCY DEPARTMENT Provider Note   CSN: [id number] Arrival date & time: 11/08/21  1609     History Chief Complaint  Patient presents with   Fever    Aaron Gutierrez is a 30 y.o. male with no reported past medical history.  Presents to the emergency department chief complaint of fever, headache, fatigue, and nonproductive cough.  Patient states that his symptoms started earlier today.  Patient reports headache onset was gradual pain progressively worse over time.  Pain is located to frontotemporal aspect of his head.  Patient rates pain 7/10 on the pain scale.  Patient denies any aggravating factors.  Patient has not tried any modalities to alleviate his symptoms.  Patient reports cough is nonproductive.  Patient has tried over-the-counter cough medicine with minimal improvement in his symptoms.  Endorses generalized fatigue.  Patient reports subjective fever at home.  Has not taken any antipyretics.  Patient denies any known sick contacts.   Fever Associated symptoms: cough and headaches   Associated symptoms: no chest pain, no chills, no confusion, no congestion, no diarrhea, no dysuria, no nausea, no rash, no rhinorrhea, no sore throat and no vomiting       History reviewed. No pertinent past medical history.  There are no problems to display for this patient.   History reviewed. No pertinent surgical history.     No family history on file.  Social History   Tobacco Use   Smoking status: Former   Smokeless tobacco: Current    Types: Snuff  Vaping Use   Vaping Use: Never used  Substance Use Topics   Alcohol use: Yes    Comment: socially    Drug use: No    Home Medications Prior to Admission medications   Medication Sig Start Date End Date Taking? Authorizing Provider  dicyclomine (BENTYL) 20 MG tablet Take 1 tablet (20 mg total) by mouth every 6 (six) hours as needed for spasms (abdominal cramping). 03/23/16   Samuel Jester, DO  famotidine  (PEPCID) 20 MG tablet Take 1 tablet (20 mg total) by mouth 2 (two) times daily. 03/23/16   Samuel Jester, DO  lidocaine (LIDODERM) 5 % Place 1 patch onto the skin daily. Remove & Discard patch within 12 hours or as directed by MD 08/07/21   Sabas Sous, MD  meloxicam (MOBIC) 7.5 MG tablet Take 1 tablet (7.5 mg total) by mouth daily. 08/19/21   Theron Arista, PA-C  methocarbamol (ROBAXIN) 500 MG tablet Take 1 tablet (500 mg total) by mouth every 8 (eight) hours as needed for muscle spasms. 08/07/21   Sabas Sous, MD  ondansetron (ZOFRAN) 4 MG tablet Take 1 tablet (4 mg total) by mouth every 8 (eight) hours as needed for nausea or vomiting. 03/23/16   Samuel Jester, DO  oxyCODONE-acetaminophen (PERCOCET/ROXICET) 5-325 MG tablet Take 1 tablet by mouth every 6 (six) hours as needed for severe pain. 08/11/21   Triplett, Tammy, PA-C  oxyCODONE-acetaminophen (PERCOCET/ROXICET) 5-325 MG tablet Take 1 tablet by mouth every 6 (six) hours as needed for severe pain. 08/11/21   Triplett, Tammy, PA-C  predniSONE (DELTASONE) 10 MG tablet Take 6 tablets day one, 5 tablets day two, 4 tablets day three, 3 tablets day four, 2 tablets day five, then 1 tablet day six 08/11/21   Triplett, Tammy, PA-C    Allergies    Patient has no known allergies.  Review of Systems   Review of Systems  Constitutional:  Positive for fatigue and fever. Negative for chills.  HENT:  Negative for congestion, rhinorrhea and sore throat.   Eyes:  Negative for visual disturbance.  Respiratory:  Positive for cough. Negative for shortness of breath.   Cardiovascular:  Negative for chest pain.  Gastrointestinal:  Negative for abdominal pain, diarrhea, nausea and vomiting.  Genitourinary:  Negative for difficulty urinating, dysuria, frequency and hematuria.  Musculoskeletal:  Negative for back pain and neck pain.  Skin:  Negative for color change and rash.  Neurological:  Positive for headaches. Negative for dizziness, tremors, seizures,  syncope, facial asymmetry, speech difficulty, weakness, light-headedness and numbness.  Psychiatric/Behavioral:  Negative for confusion.    Physical Exam Updated Vital Signs BP (!) 131/94 (BP Location: Right Arm)    Pulse (!) 108    Temp (!) 101.2 F (38.4 C) (Oral)    Resp 20    SpO2 98%   Physical Exam Vitals and nursing note reviewed.  Constitutional:      General: He is not in acute distress.    Appearance: He is ill-appearing. He is not toxic-appearing or diaphoretic.  HENT:     Head: Normocephalic.  Eyes:     General: No scleral icterus.       Right eye: No discharge.        Left eye: No discharge.  Cardiovascular:     Rate and Rhythm: Tachycardia present.     Comments: Tachycardic at rate of 108 Pulmonary:     Effort: Pulmonary effort is normal. No tachypnea, bradypnea or respiratory distress.     Breath sounds: Normal breath sounds. No stridor. No decreased breath sounds, wheezing, rhonchi or rales.     Comments: Speaks in full sentences without difficulty Abdominal:     General: There is no distension. There are no signs of injury.     Palpations: Abdomen is soft. There is no mass or pulsatile mass.     Tenderness: There is no abdominal tenderness. There is no guarding or rebound.  Musculoskeletal:     Right lower leg: Normal.     Left lower leg: Normal.  Skin:    General: Skin is warm and dry.  Neurological:     General: No focal deficit present.     Mental Status: He is alert.  Psychiatric:        Behavior: Behavior is cooperative.    ED Results / Procedures / Treatments   Labs (all labs ordered are listed, but only abnormal results are displayed) Labs Reviewed  RESP PANEL BY RT-PCR (FLU A&B, COVID) [id number]    EKG None  Radiology No results found.  Procedures Procedures   Medications Ordered in ED Medications - No data to display  ED Course  I have reviewed the triage vital signs and the nursing notes.  Pertinent labs & imaging results that  were available during my care of the patient were reviewed by me and considered in my medical decision making (see chart for details).    MDM Rules/Calculators/A&P                          Alert 30 year old male no acute distress, nontoxic-appearing.  Patient does appear ill.  Presents to the emergency department with a chief complaint of fatigue, fever, nonproductive cough, and headache.  Symptom onset earlier today.  No known sick contacts.  Patient noted to be febrile at 101.2 F.  Patient denies taking any antipyretics today.  We will give patient Tylenol at this time.  Patient is also noted  to be tachycardic at rate of 108.  Suspect this is due to his tachycardia.  Respiratory rate within normal limits oxygen saturation 98% on room air.  Lungs clear to auscultation bilaterally.  Low suspicion for pneumonia at this time.  Headache onset was gradual pain progressively worse over time.  No aggravating factors.  Patient moves all limbs equally without difficulty.  No dysarthria.  Denies any recent falls or traumatic injuries.  Low suspicion for subarachnoid hemorrhage at this time.  Suspect that patient symptoms are due to viral illness.  Patient will be swabbed for COVID-19 and influenza.  Test pending at this time.  Will prescribe patient with Tessalon and Zofran.  Discussed symptomatic treatment.  patient given information on how to follow-up with his test results on [facility] MyChart.  Strict return precautions given.  Patient to follow-up with PCP if symptoms do not improve in 7 to 10 days.  Discussed results, findings, treatment and follow up. Patient advised of return precautions. Patient verbalized understanding and agreed with plan.      Final Clinical Impression(s) / ED Diagnoses Final diagnoses:  Viral illness    Rx / DC Orders ED Discharge Orders          Ordered    benzonatate (TESSALON) 100 MG capsule  Every 8 hours PRN        11/08/21 1637    ondansetron (ZOFRAN) 4 MG  tablet  Every 8 hours PRN        11/08/21 1637             Dyann Ruddle 11/08/21 1738    Noemi Chapel, MD 11/10/21 1715

## 2021-11-08 NOTE — Discharge Instructions (Signed)
You came to the emergency department today with reports of Covid-19/flu like symptoms.  These symptoms may be due to Covid 19, the flue or another viral illness.  You have a COVID and flu test pending. Please isolate at home while awaiting your results.  > If your test is negative, stay home until your fever has resolved/your symptoms are improving. > If your test is positive, isolate at home for at least 5 days after the day your symptoms initially began, and THEN at least 24 hours after you are fever-free without the help of medications (Tylenol/acetaminophen and Advil/ibuprofen/Motrin) AND your symptoms are improving.  You can alternate Tylenol/acetaminophen and Advil/ibuprofen/Motrin every 4 hours for sore throat, body aches, headache or fever.  Drink plenty of water.  Use saline nasal spray for congestion. You can take Tessalon every 8 hours as needed for cough. You can take Zofran every 8 hours as needed for nausea and vomiting. Wash your hands frequently. Please rest as needed with frequent repositioning and ambulation as tolerated.    If you use a CPAP or BiPAP device for management of obstructive sleep apnea may continue to use it however use it when isolated from other individuals to avoid spread of COVID-19.   If you use a nebulizer administer medication such as albuterol you may continue to use it however only one isolated from other individuals to avoid the spread of COVID-19.  If your symptoms do not improve please follow-up with your primary care provider or urgent care.  Return to the ER for significant shortness of breath, uncontrollable vomiting, severe chest pain, inability to tolerate fluids, changes in mental status such as confusion or other concerning symptoms.

## 2021-11-08 NOTE — ED Triage Notes (Signed)
Fever onset today

## 2022-12-23 ENCOUNTER — Emergency Department (HOSPITAL_COMMUNITY)
Admission: EM | Admit: 2022-12-23 | Discharge: 2022-12-23 | Disposition: A | Discharge status: Home / Self Care | Payer: Self-pay | Attending: Emergency Medicine | Admitting: Emergency Medicine

## 2022-12-23 ENCOUNTER — Other Ambulatory Visit: Payer: Self-pay

## 2022-12-23 ENCOUNTER — Encounter (HOSPITAL_COMMUNITY): Payer: Self-pay

## 2022-12-23 ENCOUNTER — Emergency Department (HOSPITAL_COMMUNITY): Payer: Self-pay

## 2022-12-23 DIAGNOSIS — U071 COVID-19: Secondary | ICD-10-CM | POA: Insufficient documentation

## 2022-12-23 LAB — COMPREHENSIVE METABOLIC PANEL
ALT: 47 U/L — ABNORMAL HIGH (ref 0–44)
AST: 39 U/L (ref 15–41)
Albumin: 4.2 g/dL (ref 3.5–5.0)
Alkaline Phosphatase: 44 U/L (ref 38–126)
Anion gap: 9 (ref 5–15)
BUN: 10 mg/dL (ref 6–20)
CO2: 25 mmol/L (ref 22–32)
Calcium: 9.1 mg/dL (ref 8.9–10.3)
Chloride: 104 mmol/L (ref 98–111)
Creatinine, Ser: 0.87 mg/dL (ref 0.61–1.24)
GFR, Estimated: >60 mL/min (ref >60)
Glucose, Bld: 104 mg/dL — ABNORMAL HIGH (ref 70–99)
Potassium: 3.8 mmol/L (ref 3.5–5.1)
Sodium: 138 mmol/L (ref 135–145)
Total Bilirubin: 0.7 mg/dL (ref 0.3–1.2)
Total Protein: 7.8 g/dL (ref 6.5–8.1)

## 2022-12-23 LAB — CBC WITH DIFFERENTIAL/PLATELET
Abs Immature Granulocytes: 0.03 10*3/uL (ref 0.00–0.07)
Basophils Absolute: 0 10*3/uL (ref 0.0–0.1)
Basophils Relative: 0 %
Eosinophils Absolute: 0.2 10*3/uL (ref 0.0–0.5)
Eosinophils Relative: 4 %
HCT: 41.6 % (ref 39.0–52.0)
Hemoglobin: 13.9 g/dL (ref 13.0–17.0)
Immature Granulocytes: 1 %
Lymphocytes Relative: 8 %
Lymphs Abs: 0.5 10*3/uL — ABNORMAL LOW (ref 0.7–4.0)
MCH: 29.6 pg (ref 26.0–34.0)
MCHC: 33.4 g/dL (ref 30.0–36.0)
MCV: 88.5 fL (ref 80.0–100.0)
Monocytes Absolute: 0.7 10*3/uL (ref 0.1–1.0)
Monocytes Relative: 13 %
Neutro Abs: 4.4 10*3/uL (ref 1.7–7.7)
Neutrophils Relative %: 74 %
Platelets: 234 10*3/uL (ref 150–400)
RBC: 4.7 MIL/uL (ref 4.22–5.81)
RDW: 12 % (ref 11.5–15.5)
WBC: 5.8 10*3/uL (ref 4.0–10.5)
nRBC: 0 % (ref 0.0–0.2)

## 2022-12-23 LAB — RESP PANEL BY RT-PCR (RSV, FLU A&B, COVID)  RVPGX2
Influenza A by PCR: NEGATIVE
Influenza B by PCR: NEGATIVE
Resp Syncytial Virus by PCR: NEGATIVE
SARS Coronavirus 2 by RT PCR: POSITIVE — AB

## 2022-12-23 MED ORDER — KETOROLAC TROMETHAMINE 30 MG/ML IJ SOLN
30.0000 mg | Freq: Once | INTRAMUSCULAR | Status: AC
  Administered 2022-12-23: 30 mg via INTRAVENOUS
  Filled 2022-12-23: qty 1

## 2022-12-23 MED ORDER — SODIUM CHLORIDE 0.9 % IV BOLUS
1000.0000 mL | Freq: Once | INTRAVENOUS | Status: AC
  Administered 2022-12-23: 1000 mL via INTRAVENOUS

## 2022-12-23 NOTE — Discharge Instructions (Signed)
Drink plenty of fluids.  Take Tylenol or Motrin for fever and aches.  Get rechecked if any problems

## 2022-12-23 NOTE — ED Notes (Signed)
AVS with prescriptions provided to and discussed with patient and family member at bedside. Pt verbalizes understanding of discharge instructions and denies any questions or concerns at this time. Pt has ride home. Pt ambulated out of department independently with steady gait.  

## 2022-12-23 NOTE — ED Notes (Signed)
Portable Xray at bedside.

## 2022-12-23 NOTE — ED Triage Notes (Signed)
Pt presents with fever, chills, weakness, cough x couple days.

## 2022-12-23 NOTE — ED Provider Notes (Signed)
[facility] Provider Note   CSN: [id number] Arrival date & time: 12/23/22  6967     History  Chief Complaint  Patient presents with   Flu like symptoms    Aaron Gutierrez is a 32 y.o. male.  Patient complains of fevers and chills and aches.  Patient has no medical problems  The history is provided by the patient and medical records. No language interpreter was used.  Weakness Severity:  Moderate Onset quality:  Sudden Timing:  Constant Progression:  Waxing and waning Chronicity:  New Context: not alcohol use   Relieved by:  Nothing Worsened by:  Nothing Ineffective treatments:  None tried Associated symptoms: no abdominal pain, no chest pain, no cough, no diarrhea, no frequency, no headaches and no seizures   Risk factors: no anemia        Home Medications Prior to Admission medications   Medication Sig Start Date End Date Taking? Authorizing Provider  benzonatate (TESSALON) 100 MG capsule Take 2 capsules (200 mg total) by mouth every 8 (eight) hours as needed for cough. 11/08/21   Loni Beckwith, PA-C  dicyclomine (BENTYL) 20 MG tablet Take 1 tablet (20 mg total) by mouth every 6 (six) hours as needed for spasms (abdominal cramping). 03/23/16   Francine Graven, DO  famotidine (PEPCID) 20 MG tablet Take 1 tablet (20 mg total) by mouth 2 (two) times daily. 03/23/16   Francine Graven, DO  lidocaine (LIDODERM) 5 % Place 1 patch onto the skin daily. Remove & Discard patch within 12 hours or as directed by MD 08/07/21   Maudie Flakes, MD  meloxicam (MOBIC) 7.5 MG tablet Take 1 tablet (7.5 mg total) by mouth daily. 08/19/21   Sherrill Raring, PA-C  methocarbamol (ROBAXIN) 500 MG tablet Take 1 tablet (500 mg total) by mouth every 8 (eight) hours as needed for muscle spasms. 08/07/21   Maudie Flakes, MD  ondansetron (ZOFRAN) 4 MG tablet Take 1 tablet (4 mg total) by mouth every 8 (eight) hours as needed for nausea or vomiting.  11/08/21   Loni Beckwith, PA-C  oxyCODONE-acetaminophen (PERCOCET/ROXICET) 5-325 MG tablet Take 1 tablet by mouth every 6 (six) hours as needed for severe pain. 08/11/21   Triplett, Tammy, PA-C  oxyCODONE-acetaminophen (PERCOCET/ROXICET) 5-325 MG tablet Take 1 tablet by mouth every 6 (six) hours as needed for severe pain. 08/11/21   Triplett, Tammy, PA-C  predniSONE (DELTASONE) 10 MG tablet Take 6 tablets day one, 5 tablets day two, 4 tablets day three, 3 tablets day four, 2 tablets day five, then 1 tablet day six 08/11/21   Triplett, Tammy, PA-C      Allergies    Patient has no known allergies.    Review of Systems   Review of Systems  Constitutional:  Negative for appetite change and fatigue.  HENT:  Negative for congestion, ear discharge and sinus pressure.   Eyes:  Negative for discharge.  Respiratory:  Negative for cough.   Cardiovascular:  Negative for chest pain.  Gastrointestinal:  Negative for abdominal pain and diarrhea.  Genitourinary:  Negative for frequency and hematuria.  Musculoskeletal:  Negative for back pain.  Skin:  Negative for rash.  Neurological:  Positive for weakness. Negative for seizures and headaches.  Psychiatric/Behavioral:  Negative for hallucinations.     Physical Exam Updated Vital Signs BP 119/79 (BP Location: Left Arm)   Pulse (!) 114   Temp 99.7 F (37.6 C) (Oral)   Resp 20  SpO2 96%  Physical Exam Vitals and nursing note reviewed.  Constitutional:      Appearance: He is well-developed.  HENT:     Head: Normocephalic.     Nose: Nose normal.  Eyes:     General: No scleral icterus.    Conjunctiva/sclera: Conjunctivae normal.  Neck:     Thyroid: No thyromegaly.  Cardiovascular:     Rate and Rhythm: Normal rate and regular rhythm.     Heart sounds: No murmur heard.    No friction rub. No gallop.  Pulmonary:     Breath sounds: No stridor. No wheezing or rales.  Chest:     Chest wall: No tenderness.  Abdominal:     General: There  is no distension.     Tenderness: There is no abdominal tenderness. There is no rebound.  Musculoskeletal:        General: Normal range of motion.     Cervical back: Neck supple.  Lymphadenopathy:     Cervical: No cervical adenopathy.  Skin:    Findings: No erythema or rash.  Neurological:     Mental Status: He is alert and oriented to person, place, and time.     Motor: No abnormal muscle tone.     Coordination: Coordination normal.  Psychiatric:        Behavior: Behavior normal.     ED Results / Procedures / Treatments   Labs (all labs ordered are listed, but only abnormal results are displayed) Labs Reviewed  RESP PANEL BY RT-PCR (RSV, FLU A&B, COVID)  RVPGX2 - Abnormal; Notable for the following components:      Result Value   SARS Coronavirus 2 by RT PCR POSITIVE (*)    All other components within normal limits  CBC WITH DIFFERENTIAL/PLATELET - Abnormal; Notable for the following components:   Lymphs Abs 0.5 (*)    All other components within normal limits  COMPREHENSIVE METABOLIC PANEL - Abnormal; Notable for the following components:   Glucose, Bld 104 (*)    ALT 47 (*)    All other components within normal limits    EKG None  Radiology DG Chest Port 1 View  Result Date: 12/23/2022 CLINICAL DATA:  Weakness.  Cough. EXAM: PORTABLE CHEST 1 VIEW COMPARISON:  Chest x-ray May 2017 FINDINGS: Cardiomediastinal silhouette is stable. No pneumothorax. Multiple bilateral small pulmonary nodules, denser than adjacent bone, suggesting sequela of prior granulomatous disease. No pneumothorax. No infiltrate. No other acute abnormalities. IMPRESSION: 1. Multiple small pulmonary nodules, some of which were present on the Mar 23, 2016 comparison study, denser than adjacent bone, suggesting sequela of prior granulomatous disease. 2. No other acute abnormalities. Electronically Signed   By: Dorise Bullion III M.D.   On: 12/23/2022 07:47    Procedures Procedures    Medications Ordered  in ED Medications  sodium chloride 0.9 % bolus 1,000 mL (1,000 mLs Intravenous New Bag/Given 12/23/22 0806)  ketorolac (TORADOL) 30 MG/ML injection 30 mg (30 mg Intravenous Given 12/23/22 1761)    ED Course/ Medical Decision Making/ A&P                             Medical Decision Making Amount and/or Complexity of Data Reviewed Labs: ordered. Radiology: ordered.  Risk Prescription drug management.  This patient presents to the ED for concern of fever and chills, this involves an extensive number of treatment options, and is a complaint that carries with it a high risk of  complications and morbidity.  The differential diagnosis includes viral infection, sepsis   Co morbidities that complicate the patient evaluation  No medical problems   Additional history obtained:  Additional history obtained from family External records from outside source obtained and reviewed including full records   Lab Tests:  I Ordered, and personally interpreted labs.  The pertinent results include: COVID test positive   Imaging Studies ordered:  I ordered imaging studies including chest x-ray I independently visualized and interpreted imaging which showed unremarkable I agree with the radiologist interpretation   Cardiac Monitoring: / EKG:  The patient was maintained on a cardiac monitor.  I personally viewed and interpreted the cardiac monitored which showed an underlying rhythm of: Normal sinus rhythm   Consultations Obtained:  No consultant Problem List / ED Course / Critical interventions / Medication management  COVID-19 I ordered medication including normal saline for dehydration and Toradol for aches Reevaluation of the patient after these medicines showed that the patient improved I have reviewed the patients home medicines and have made adjustments as needed   Social Determinants of Health:  None   Test / Admission - Considered:  None  Patient with COVID-19.  Patient  refused Paxlovid.  He will take Tylenol Motrin and stay out of work until Monday        Final Clinical Impression(s) / ED Diagnoses Final diagnoses:  COVID-19    Rx / DC Orders ED Discharge Orders     None         Milton Ferguson, MD 12/23/22 [phone]

## 2023-04-18 ENCOUNTER — Other Ambulatory Visit: Payer: Self-pay

## 2023-04-18 ENCOUNTER — Encounter (HOSPITAL_COMMUNITY): Payer: Self-pay | Admitting: Emergency Medicine

## 2023-04-18 ENCOUNTER — Emergency Department (HOSPITAL_COMMUNITY)
Admission: EM | Admit: 2023-04-18 | Discharge: 2023-04-19 | Disposition: A | Discharge status: Home / Self Care | Payer: Self-pay | Attending: Emergency Medicine | Admitting: Emergency Medicine

## 2023-04-18 ENCOUNTER — Emergency Department (HOSPITAL_COMMUNITY): Payer: Self-pay

## 2023-04-18 DIAGNOSIS — M25551 Pain in right hip: Secondary | ICD-10-CM | POA: Insufficient documentation

## 2023-04-18 MED ORDER — KETOROLAC TROMETHAMINE 30 MG/ML IJ SOLN
30.0000 mg | Freq: Once | INTRAMUSCULAR | Status: AC
  Administered 2023-04-18: 30 mg via INTRAMUSCULAR
  Filled 2023-04-18: qty 1

## 2023-04-18 NOTE — ED Triage Notes (Signed)
Pt c/o acute exacerbation of right hip pain due to injury sustained while in the [employer] 12 years ago. He believes he is having a muscle spasm. Pain is constant and rated 7/10 constant stabbing with occasional spikes to more intense discomfort. Pt took tylenol arthritis 650mg  PTA
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
## 2023-04-19 MED ORDER — NAPROXEN 500 MG PO TABS: 500.0000 mg | ORAL_TABLET | Freq: Two times a day (BID) | ORAL | 0 refills | Status: DC

## 2023-04-19 MED ORDER — HYDROCODONE-ACETAMINOPHEN 5-325 MG PO TABS: 1.0000 | ORAL_TABLET | Freq: Four times a day (QID) | ORAL | 0 refills | Status: DC | PRN

## 2023-04-19 MED ORDER — HYDROCODONE-ACETAMINOPHEN 5-325 MG PO TABS
2.0000 | ORAL_TABLET | Freq: Once | ORAL | Status: AC
  Administered 2023-04-19: 2 via ORAL
  Filled 2023-04-19: qty 2

## 2023-08-20 IMAGING — MR MR LUMBAR SPINE W/O CM
5 series · 33 of 48 positions shown · non-contrast
Comparison: None.

CLINICAL DATA: Low back pain for approximately 1 week after lifting
injury

EXAM:
MRI LUMBAR SPINE WITHOUT CONTRAST
TECHNIQUE: Multiplanar, multisequence MR imaging of the lumbar spine was
performed. No intravenous contrast was administered.

[Series 5: T2 · sagittal · 4.0mm · 0.88mm/px · 6 of 13 slices shown (1 of 2)]
[im 1/13]
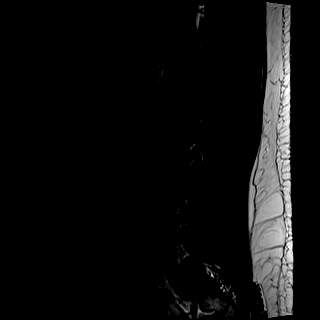
[im 3/13]
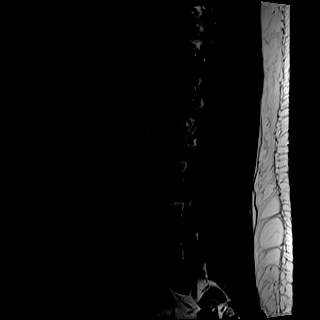
[im 5/13]
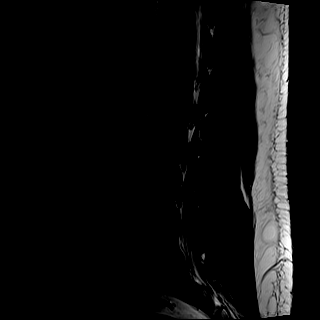
[im 8/13]
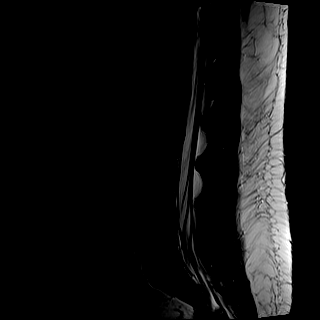
[im 10/13]
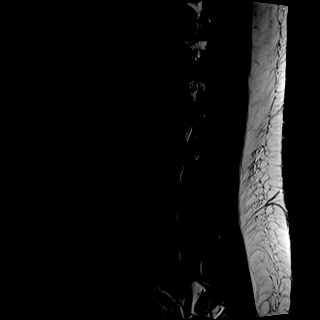
[im 13/13]
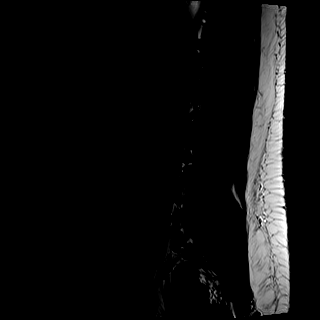

[Series 6: STIR · sagittal · 4.0mm · 0.55mm/px · 3 of 13 slices shown]
[im 1/13]
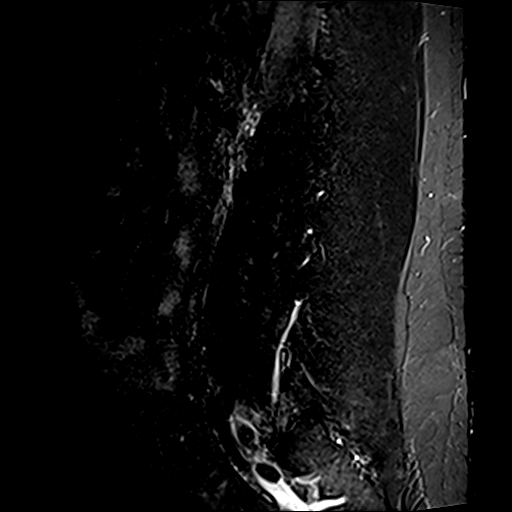
[im 3/13]
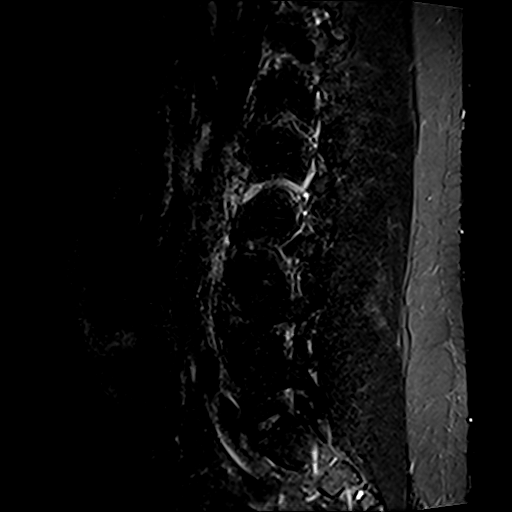
[im 5/13]
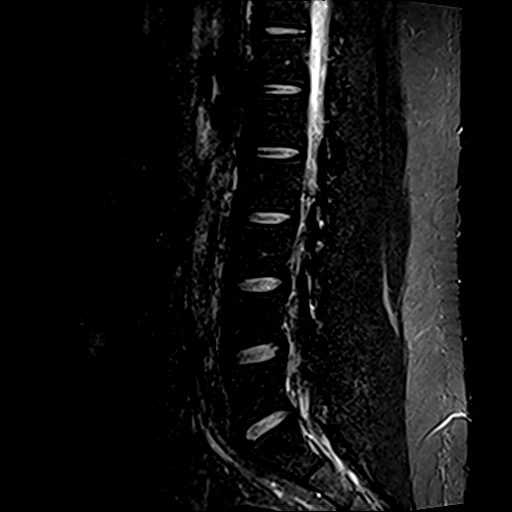

[Series 7: T1 · sagittal · 4.0mm · 1.09mm/px · 6 of 13 slices shown (1 of 2)]
[im 1/13]
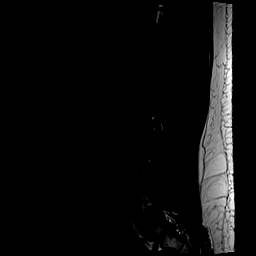
[im 3/13]
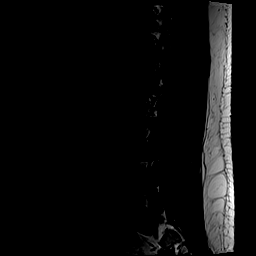
[im 5/13]
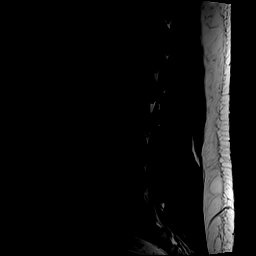
[im 8/13]
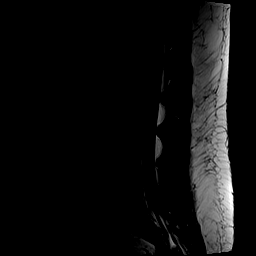
[im 10/13]
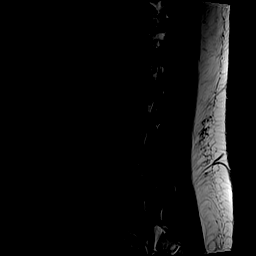
[im 13/13]
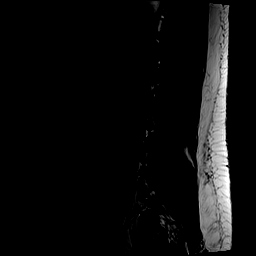

[Series 8: T2 · axial · 4.0mm · 0.70mm/px · z∈[-144,+80]mm · 9 of 34 slices shown (2 of 2)]
[im 1/34]
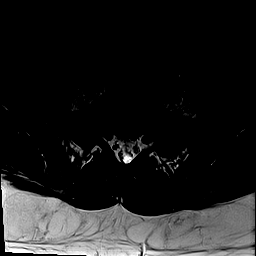
[im 5/34]
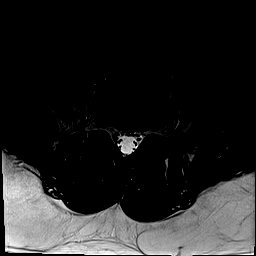
[im 10/34]
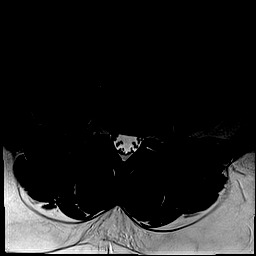
[im 15/34]
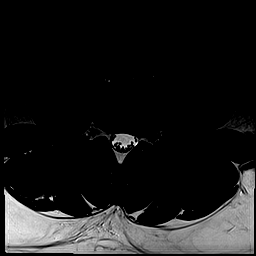
[im 17/34]
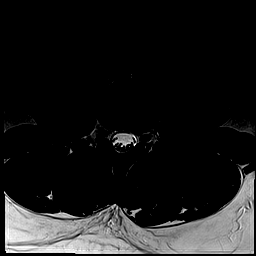
[im 19/34]
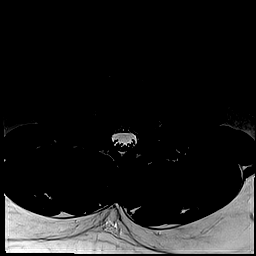
[im 24/34]
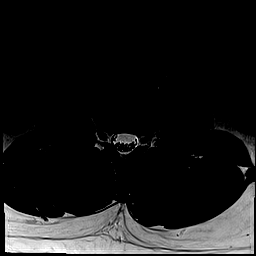
[im 29/34]
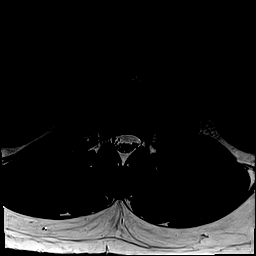
[im 34/34]
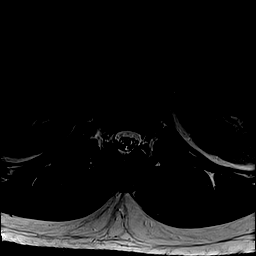

[Series 9: T1 · axial · 4.0mm · 0.35mm/px · z∈[-144,+80]mm · 9 of 34 slices shown (2 of 2)]
[im 1/34]
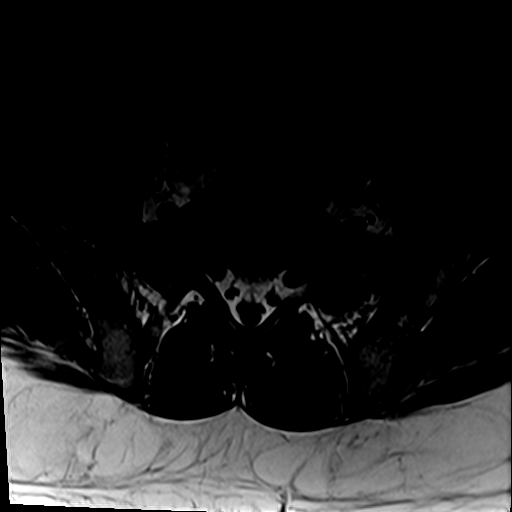
[im 5/34]
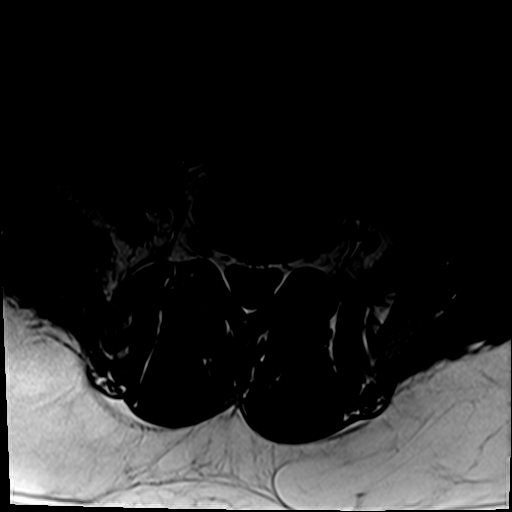
[im 10/34]
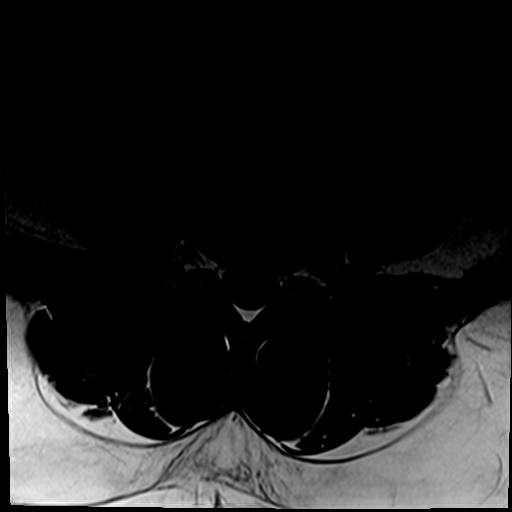
[im 15/34]
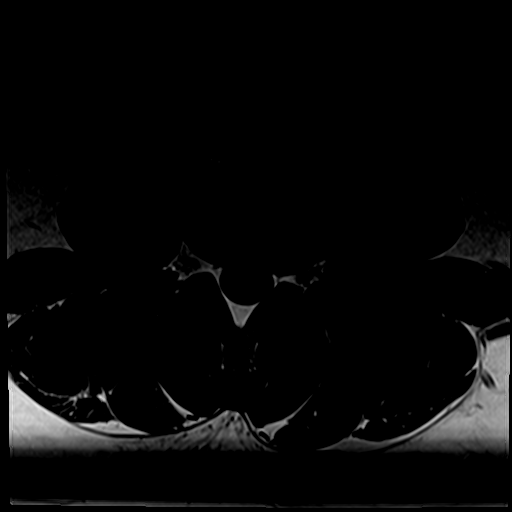
[im 17/34]
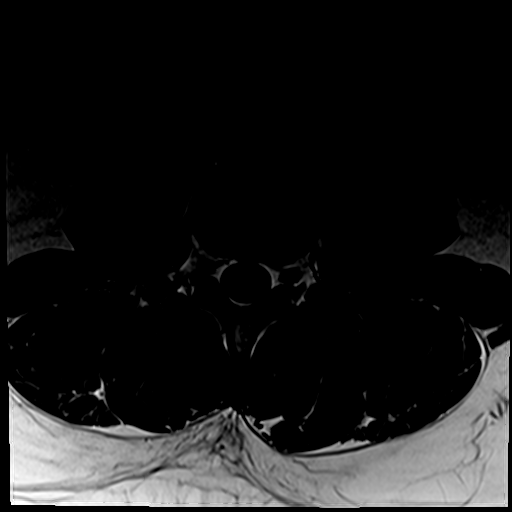
[im 19/34]
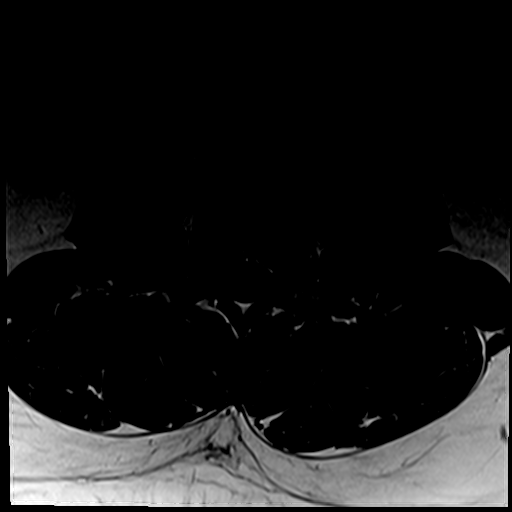
[im 24/34]
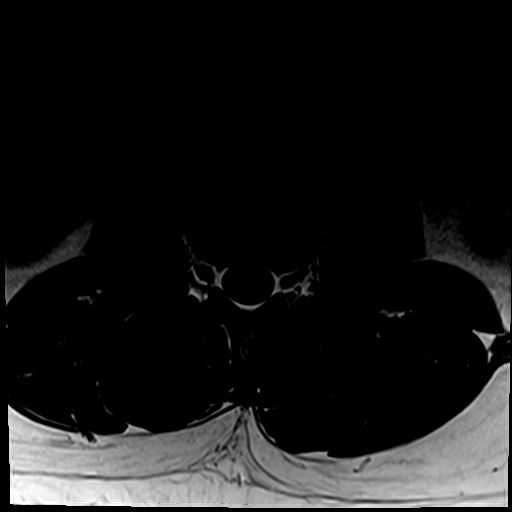
[im 29/34]
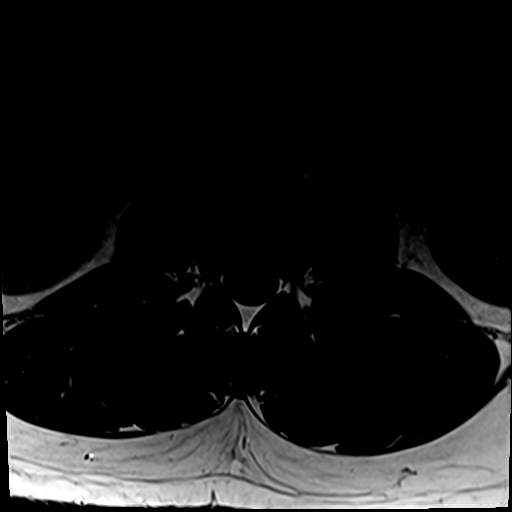
[im 34/34]
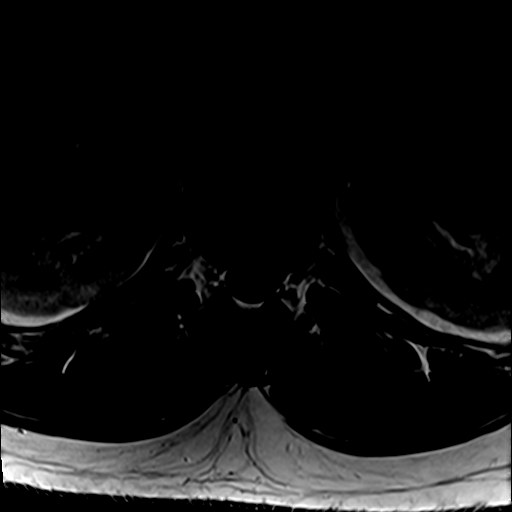

[33 of 48 positions shown; findings below may reference images not displayed]

FINDINGS: Segmentation: Presumed standard anatomy with the inferior-most well
developed disc space designated as L5-S1.

Alignment:  Physiologic.

Vertebrae:  No fracture, evidence of discitis, or bone lesion.

Conus medullaris and cauda equina: Conus extends to the T12-L1
level. Conus and cauda equina appear normal.

Paraspinal and other soft tissues: Negative. No paraspinal
intramuscular edema.

Disc levels:

Normal appearance of the lumbar intervertebral discs without disc
desiccation or focal disc protrusion. Normal facet joints without
significant arthropathy. No facet joint effusions. No foraminal or
canal stenosis at any level.
IMPRESSION: Normal MRI of the lumbar spine.

## 2023-09-23 ENCOUNTER — Emergency Department (HOSPITAL_COMMUNITY)
Admission: EM | Admit: 2023-09-23 | Discharge: 2023-09-23 | Disposition: A | Discharge status: Home / Self Care | Payer: No Typology Code available for payment source | Attending: Emergency Medicine | Admitting: Emergency Medicine

## 2023-09-23 ENCOUNTER — Encounter (HOSPITAL_COMMUNITY): Payer: Self-pay | Admitting: Radiology

## 2023-09-23 ENCOUNTER — Other Ambulatory Visit: Payer: Self-pay

## 2023-09-23 DIAGNOSIS — M25511 Pain in right shoulder: Secondary | ICD-10-CM | POA: Insufficient documentation

## 2023-09-23 DIAGNOSIS — M25551 Pain in right hip: Secondary | ICD-10-CM | POA: Insufficient documentation

## 2023-09-23 MED ORDER — HYDROCODONE-ACETAMINOPHEN 5-325 MG PO TABS
2.0000 | ORAL_TABLET | Freq: Once | ORAL | Status: AC
Start: 2023-09-23 — End: 2023-09-23
  Administered 2023-09-23: 2 via ORAL
  Filled 2023-09-23: qty 2

## 2023-09-23 MED ORDER — METHOCARBAMOL 500 MG PO TABS: 500.0000 mg | ORAL_TABLET | Freq: Three times a day (TID) | ORAL | 0 refills | Status: DC

## 2023-09-23 MED ORDER — METHOCARBAMOL 500 MG PO TABS
500.0000 mg | ORAL_TABLET | Freq: Once | ORAL | Status: AC
  Administered 2023-09-23: 500 mg via ORAL
  Filled 2023-09-23: qty 1

## 2023-09-23 MED ORDER — HYDROCODONE-ACETAMINOPHEN 5-325 MG PO TABS: ORAL_TABLET | ORAL | 0 refills | Status: DC

## 2023-09-23 NOTE — ED Notes (Signed)
Provider with pt.

## 2023-09-23 NOTE — Discharge Instructions (Signed)
Take the prescription medication as directed.  You may alternate with over-the-counter ibuprofen 600 to 800 mg 3 times a day with food.  You can take this in between times for your pain medication.  You may alternate ice and heat as well.  Follow-up with your primary care provider for recheck.

## 2023-09-23 NOTE — ED Notes (Signed)
Blanket given to pt Pt stated provider has not seen him yet and is requesting something for pain

## 2023-09-23 NOTE — ED Notes (Signed)
Pt stated that pain is slightly decreased
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
## 2023-09-23 NOTE — ED Triage Notes (Signed)
Pt states he is having a flare up of an injury he sustained in the [employer]. R hip pain started Sunday by Tuesday the pain had travel up to his shoulder. Pt states pain is constant.

## 2023-09-24 ENCOUNTER — Telehealth (HOSPITAL_COMMUNITY): Payer: Self-pay | Admitting: Student

## 2023-09-24 MED ORDER — METHOCARBAMOL 500 MG PO TABS: 500.0000 mg | ORAL_TABLET | Freq: Three times a day (TID) | ORAL | 0 refills | Status: AC

## 2023-09-24 MED ORDER — LIDOCAINE 5 % EX PTCH: 1.0000 | MEDICATED_PATCH | CUTANEOUS | 0 refills | Status: AC

## 2023-09-24 MED ORDER — HYDROCODONE-ACETAMINOPHEN 5-325 MG PO TABS: ORAL_TABLET | ORAL | 0 refills | Status: AC

## 2023-09-24 MED ORDER — NAPROXEN 500 MG PO TABS: 500.0000 mg | ORAL_TABLET | Freq: Two times a day (BID) | ORAL | 0 refills | Status: AC

## 2023-09-24 NOTE — Telephone Encounter (Signed)
Note created to send medicines to the [facility] pharmacy

## 2023-12-27 ENCOUNTER — Emergency Department (HOSPITAL_COMMUNITY): Payer: No Typology Code available for payment source

## 2023-12-27 ENCOUNTER — Emergency Department (HOSPITAL_COMMUNITY)
Admission: EM | Admit: 2023-12-27 | Discharge: 2023-12-27 | Disposition: A | Discharge status: Home / Self Care | Payer: No Typology Code available for payment source | Attending: Emergency Medicine | Admitting: Emergency Medicine

## 2023-12-27 ENCOUNTER — Other Ambulatory Visit: Payer: Self-pay

## 2023-12-27 DIAGNOSIS — S161XXA Strain of muscle, fascia and tendon at neck level, initial encounter: Secondary | ICD-10-CM | POA: Diagnosis not present

## 2023-12-27 DIAGNOSIS — Y92410 Unspecified street and highway as the place of occurrence of the external cause: Secondary | ICD-10-CM | POA: Insufficient documentation

## 2023-12-27 DIAGNOSIS — S199XXA Unspecified injury of neck, initial encounter: Secondary | ICD-10-CM | POA: Diagnosis present

## 2023-12-27 DIAGNOSIS — M542 Cervicalgia: Secondary | ICD-10-CM

## 2023-12-27 DIAGNOSIS — T148XXA Other injury of unspecified body region, initial encounter: Secondary | ICD-10-CM

## 2023-12-27 MED ORDER — METHOCARBAMOL 500 MG PO TABS
500.0000 mg | ORAL_TABLET | Freq: Once | ORAL | Status: AC
  Administered 2023-12-27: 500 mg via ORAL
  Filled 2023-12-27: qty 1

## 2023-12-27 MED ORDER — NAPROXEN 375 MG PO TABS: 375.0000 mg | ORAL_TABLET | Freq: Two times a day (BID) | ORAL | 0 refills | Status: AC

## 2023-12-27 MED ORDER — LIDOCAINE 5 % EX PTCH
2.0000 | MEDICATED_PATCH | CUTANEOUS | Status: DC
  Filled 2023-12-27: qty 2

## 2023-12-27 MED ORDER — METHOCARBAMOL 500 MG PO TABS: 500.0000 mg | ORAL_TABLET | Freq: Two times a day (BID) | ORAL | 0 refills | Status: AC

## 2023-12-27 MED ORDER — NAPROXEN 250 MG PO TABS
500.0000 mg | ORAL_TABLET | Freq: Once | ORAL | Status: AC
  Administered 2023-12-27: 500 mg via ORAL
  Filled 2023-12-27: qty 2
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
## 2023-12-27 NOTE — Discharge Instructions (Addendum)
 Thank you for letting us  evaluate you today.  Your x-rays were negative for fracture.  Likely pain is due to mostly to muscle strain.  We have provided you with lidocaine  patch, muscle relaxer, strong ibuprofen  here in emergency department for your pain.  Have also sent a muscle relaxer and strong ibuprofen  to your pharmacy to use as needed for pain.  Do not drink alcohol or operate heavy machinery on Robaxin .  You can take at night if it makes you too drowsy.  Do not take Advil , aspirin, Aleve , ibuprofen  with naproxen  as they are similar. You make pick up OTC lidocaine  patches to use as needed too  Return to emergency department if you experience syncope, altered mentation, blurred vision
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
## 2024-11-09 ENCOUNTER — Emergency Department (HOSPITAL_COMMUNITY)
Admission: EM | Admit: 2024-11-09 | Discharge: 2024-11-09 | Disposition: A | Discharge status: Home / Self Care | Attending: Emergency Medicine | Admitting: Emergency Medicine

## 2024-11-09 ENCOUNTER — Other Ambulatory Visit: Payer: Self-pay

## 2024-11-09 ENCOUNTER — Encounter (HOSPITAL_COMMUNITY): Payer: Self-pay

## 2024-11-09 DIAGNOSIS — R6889 Other general symptoms and signs: Secondary | ICD-10-CM

## 2024-11-09 DIAGNOSIS — J069 Acute upper respiratory infection, unspecified: Secondary | ICD-10-CM | POA: Diagnosis not present

## 2024-11-09 DIAGNOSIS — R509 Fever, unspecified: Secondary | ICD-10-CM | POA: Diagnosis present

## 2024-11-09 MED ORDER — IBUPROFEN 800 MG PO TABS: 800.0000 mg | ORAL_TABLET | Freq: Four times a day (QID) | ORAL | 0 refills | Status: AC | PRN

## 2024-11-09 MED ORDER — BENZONATATE 100 MG PO CAPS
200.0000 mg | ORAL_CAPSULE | Freq: Once | ORAL | Status: AC
  Administered 2024-11-09: 200 mg via ORAL
  Filled 2024-11-09: qty 2

## 2024-11-09 MED ORDER — ACETAMINOPHEN 500 MG PO TABS
1000.0000 mg | ORAL_TABLET | Freq: Once | ORAL | Status: AC
  Administered 2024-11-09: 1000 mg via ORAL
  Filled 2024-11-09: qty 2

## 2024-11-09 MED ORDER — BENZONATATE 100 MG PO CAPS: 100.0000 mg | ORAL_CAPSULE | Freq: Three times a day (TID) | ORAL | 0 refills | Status: AC

## 2024-11-09 MED ORDER — IBUPROFEN 800 MG PO TABS
800.0000 mg | ORAL_TABLET | Freq: Once | ORAL | Status: AC
  Administered 2024-11-09: 800 mg via ORAL
  Filled 2024-11-09: qty 1

## 2024-11-09 MED ORDER — HYDROCOD POLI-CHLORPHE POLI ER 10-8 MG/5ML PO SUER
5.0000 mL | Freq: Once | ORAL | Status: AC
  Administered 2024-11-09: 5 mL via ORAL
  Filled 2024-11-09: qty 5

## 2024-11-09 NOTE — ED Triage Notes (Addendum)
 Patient come in POV for a temp, generalize soreness, and cool feeling in throat on inhalation and cough. States was around his niece. Karna taking anything at home for temp.

## 2024-11-09 NOTE — ED Provider Notes (Signed)
 " [place] EMERGENCY DEPARTMENT AT [facility] Provider Note   CSN: [id number] Arrival date & time: 11/09/24  9480     Patient presents with: Fever and Cough   Aaron Gutierrez is a 33 y.o. male.   Flulike illness.  Patient sick for 2 days, worsened overnight.  Has a scratchiness in his throat, generalized aches, cough.       Prior to Admission medications  Medication Sig Start Date End Date Taking? Authorizing Provider  benzonatate  (TESSALON ) 100 MG capsule Take 1 capsule (100 mg total) by mouth every 8 (eight) hours. 11/09/24  Yes Kaicee Scarpino, Lonni PARAS, MD  ibuprofen  (ADVIL ) 800 MG tablet Take 1 tablet (800 mg total) by mouth every 6 (six) hours as needed for moderate pain (pain score 4-6). 11/09/24  Yes Ayodele Hartsock, Lonni PARAS, MD  HYDROcodone -acetaminophen  (NORCO/VICODIN) 5-325 MG tablet Take one tab po q 4 hrs prn pain 09/24/23   Kommor, Madison, MD  lidocaine  (LIDODERM ) 5 % Place 1 patch onto the skin daily. Remove & Discard patch within 12 hours or as directed by MD 09/24/23   Kommor, Lum, MD  methocarbamol  (ROBAXIN ) 500 MG tablet Take 1 tablet (500 mg total) by mouth 3 (three) times daily. 09/24/23   Kommor, Madison, MD  methocarbamol  (ROBAXIN ) 500 MG tablet Take 1 tablet (500 mg total) by mouth 2 (two) times daily. 12/27/23   Minnie Tinnie BRAVO, PA  naproxen  (NAPROSYN ) 375 MG tablet Take 1 tablet (375 mg total) by mouth 2 (two) times daily. 12/27/23   Minnie Tinnie BRAVO, PA  naproxen  (NAPROSYN ) 500 MG tablet Take 1 tablet (500 mg total) by mouth 2 (two) times daily. 09/24/23   Kommor, Lum, MD    Allergies: Patient has no known allergies.    Review of Systems  Updated Vital Signs BP (!) 134/104   Pulse (!) 109   Temp (!) 100.7 F (38.2 C) (Oral)   Resp 19   Ht 6' (1.829 m)   Wt 127 kg   SpO2 92%   BMI 37.97 kg/m   Physical Exam Vitals and nursing note reviewed.  Constitutional:      General: He is not in acute distress.    Appearance: He is  well-developed.  HENT:     Head: Normocephalic and atraumatic.     Mouth/Throat:     Mouth: Mucous membranes are moist.     Pharynx: Oropharynx is clear.     Tonsils: No tonsillar exudate or tonsillar abscesses.  Eyes:     General: Vision grossly intact. Gaze aligned appropriately.     Extraocular Movements: Extraocular movements intact.     Conjunctiva/sclera: Conjunctivae normal.  Cardiovascular:     Rate and Rhythm: Normal rate and regular rhythm.     Pulses: Normal pulses.     Heart sounds: Normal heart sounds, S1 normal and S2 normal. No murmur heard.    No friction rub. No gallop.  Pulmonary:     Effort: Pulmonary effort is normal. No respiratory distress.     Breath sounds: Normal breath sounds.  Abdominal:     Palpations: Abdomen is soft.     Tenderness: There is no abdominal tenderness. There is no guarding or rebound.     Hernia: No hernia is present.  Musculoskeletal:        General: No swelling.     Cervical back: Full passive range of motion without pain, normal range of motion and neck supple. No pain with movement, spinous process tenderness or muscular tenderness.  Normal range of motion.     Right lower leg: No edema.     Left lower leg: No edema.  Skin:    General: Skin is warm and dry.     Capillary Refill: Capillary refill takes less than 2 seconds.     Findings: No ecchymosis, erythema, lesion or wound.  Neurological:     Mental Status: He is alert and oriented to person, place, and time.     GCS: GCS eye subscore is 4. GCS verbal subscore is 5. GCS motor subscore is 6.     Cranial Nerves: Cranial nerves 2-12 are intact.     Sensory: Sensation is intact.     Motor: Motor function is intact. No weakness or abnormal muscle tone.     Coordination: Coordination is intact.  Psychiatric:        Mood and Affect: Mood normal.        Speech: Speech normal.        Behavior: Behavior normal.     (all labs ordered are listed, but only abnormal results are  displayed) Labs Reviewed - No data to display  EKG: None  Radiology: No results found.   Procedures   Medications Ordered in the ED  acetaminophen  (TYLENOL ) tablet 1,000 mg (1,000 mg Oral Given 11/09/24 0616)  ibuprofen  (ADVIL ) tablet 800 mg (800 mg Oral Given 11/09/24 0616)  chlorpheniramine-HYDROcodone  (TUSSIONEX) 10-8 MG/5ML suspension 5 mL (5 mLs Oral Given 11/09/24 0616)  benzonatate  (TESSALON ) capsule 200 mg (200 mg Oral Given 11/09/24 0616)                                    Medical Decision Making Risk OTC drugs. Prescription drug management.   Differential Diagnosis considered includes, but not limited to: COVID-19; influenza; RSV; simple viral URI; strep pharyngitis; pneumonia  Patient presents with URI/flulike symptoms.  Patient appears well.  He does have a low-grade fever but otherwise in no distress.  Oral and lung examination are unremarkable.  Influenza prevalent currently, likely flu or other viral illness.  Discussed empiric treatment with follow-up as needed, patient agrees.     Final diagnoses:  Upper respiratory tract infection, unspecified type  Flu-like symptoms    ED Discharge Orders          Ordered    benzonatate  (TESSALON ) 100 MG capsule  Every 8 hours        11/09/24 0609    ibuprofen  (ADVIL ) 800 MG tablet  Every 6 hours PRN        11/09/24 0609               Haze Lonni PARAS, MD 11/09/24 [phone]  "
